# Patient Record
Sex: Female | Born: 1940 | Race: White | Hispanic: Yes | Smoking: Never smoker
Health system: Southern US, Community
[De-identification: ages and names within clinical notes are randomized; demographics above are authoritative.]

## PROBLEM LIST (undated history)

## (undated) DIAGNOSIS — L509 Urticaria, unspecified: Secondary | ICD-10-CM

## (undated) DIAGNOSIS — E079 Disorder of thyroid, unspecified: Secondary | ICD-10-CM

## (undated) DIAGNOSIS — E039 Hypothyroidism, unspecified: Secondary | ICD-10-CM

## (undated) DIAGNOSIS — K219 Gastro-esophageal reflux disease without esophagitis: Secondary | ICD-10-CM

## (undated) HISTORY — DX: Urticaria, unspecified: L50.9

## (undated) HISTORY — DX: Gastro-esophageal reflux disease without esophagitis: K21.9

## (undated) HISTORY — PX: KNEE ARTHROSCOPY: SHX127

## (undated) HISTORY — DX: Hypothyroidism, unspecified: E03.9

---
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
## 1978-09-11 HISTORY — PX: TUBAL LIGATION: SHX77

## 1986-09-11 HISTORY — PX: OTHER SURGICAL HISTORY: SHX169

## 2001-09-11 HISTORY — PX: SURGERY OF LIP: SUR1315

## 2004-02-10 HISTORY — PX: LAPAROSCOPIC CHOLECYSTECTOMY: SUR755

## 2009-11-16 HISTORY — PX: PATELLECTOMY: SHX1022

## 2009-11-16 HISTORY — PX: KNEE SURGERY: SHX244

## 2014-12-14 HISTORY — PX: HAND SURGERY: SHX662

## 2015-11-02 DIAGNOSIS — E7800 Pure hypercholesterolemia, unspecified: Secondary | ICD-10-CM | POA: Diagnosis not present

## 2015-11-02 DIAGNOSIS — Z7189 Other specified counseling: Secondary | ICD-10-CM | POA: Diagnosis not present

## 2015-11-02 DIAGNOSIS — H6592 Unspecified nonsuppurative otitis media, left ear: Secondary | ICD-10-CM | POA: Diagnosis not present

## 2015-11-02 DIAGNOSIS — E039 Hypothyroidism, unspecified: Secondary | ICD-10-CM | POA: Diagnosis not present

## 2015-11-02 DIAGNOSIS — K649 Unspecified hemorrhoids: Secondary | ICD-10-CM | POA: Diagnosis not present

## 2015-11-02 DIAGNOSIS — R7303 Prediabetes: Secondary | ICD-10-CM | POA: Diagnosis not present

## 2015-11-02 DIAGNOSIS — K279 Peptic ulcer, site unspecified, unspecified as acute or chronic, without hemorrhage or perforation: Secondary | ICD-10-CM | POA: Diagnosis not present

## 2015-11-02 DIAGNOSIS — A048 Other specified bacterial intestinal infections: Secondary | ICD-10-CM | POA: Diagnosis not present

## 2015-11-26 DIAGNOSIS — J209 Acute bronchitis, unspecified: Secondary | ICD-10-CM | POA: Diagnosis not present

## 2015-11-26 DIAGNOSIS — J019 Acute sinusitis, unspecified: Secondary | ICD-10-CM | POA: Diagnosis not present

## 2015-11-29 DIAGNOSIS — J329 Chronic sinusitis, unspecified: Secondary | ICD-10-CM | POA: Diagnosis not present

## 2015-11-29 DIAGNOSIS — J309 Allergic rhinitis, unspecified: Secondary | ICD-10-CM | POA: Diagnosis not present

## 2015-11-29 DIAGNOSIS — E039 Hypothyroidism, unspecified: Secondary | ICD-10-CM | POA: Diagnosis not present

## 2015-11-29 DIAGNOSIS — J209 Acute bronchitis, unspecified: Secondary | ICD-10-CM | POA: Diagnosis not present

## 2015-12-03 DIAGNOSIS — J019 Acute sinusitis, unspecified: Secondary | ICD-10-CM | POA: Diagnosis not present

## 2015-12-03 DIAGNOSIS — Z6831 Body mass index (BMI) 31.0-31.9, adult: Secondary | ICD-10-CM | POA: Diagnosis not present

## 2015-12-03 DIAGNOSIS — Z0000 Encounter for general adult medical examination without abnormal findings: Secondary | ICD-10-CM | POA: Diagnosis not present

## 2015-12-03 DIAGNOSIS — J209 Acute bronchitis, unspecified: Secondary | ICD-10-CM | POA: Diagnosis not present

## 2015-12-03 DIAGNOSIS — H6503 Acute serous otitis media, bilateral: Secondary | ICD-10-CM | POA: Diagnosis not present

## 2015-12-05 ENCOUNTER — Emergency Department (HOSPITAL_COMMUNITY)
Admission: EM | Admit: 2015-12-05 | Discharge: 2015-12-05 | Disposition: A | Discharge status: Home / Self Care | Payer: PPO | Attending: Emergency Medicine | Admitting: Emergency Medicine

## 2015-12-05 ENCOUNTER — Encounter (HOSPITAL_COMMUNITY): Payer: Self-pay | Admitting: Emergency Medicine

## 2015-12-05 DIAGNOSIS — M62830 Muscle spasm of back: Secondary | ICD-10-CM | POA: Diagnosis not present

## 2015-12-05 DIAGNOSIS — Z79899 Other long term (current) drug therapy: Secondary | ICD-10-CM | POA: Diagnosis not present

## 2015-12-05 DIAGNOSIS — E079 Disorder of thyroid, unspecified: Secondary | ICD-10-CM | POA: Insufficient documentation

## 2015-12-05 DIAGNOSIS — Z880 Allergy status to penicillin: Secondary | ICD-10-CM | POA: Insufficient documentation

## 2015-12-05 DIAGNOSIS — M545 Low back pain: Secondary | ICD-10-CM | POA: Diagnosis not present

## 2015-12-05 HISTORY — DX: Disorder of thyroid, unspecified: E07.9

## 2015-12-05 LAB — URINE MICROSCOPIC-ADD ON

## 2015-12-05 LAB — I-STAT CHEM 8, ED
BUN: 15 mg/dL (ref 6–20)
CALCIUM ION: 1.25 mmol/L (ref 1.13–1.30)
Chloride: 100 mmol/L — ABNORMAL LOW (ref 101–111)
Creatinine, Ser: 0.9 mg/dL (ref 0.44–1.00)
GLUCOSE: 120 mg/dL — AB (ref 65–99)
HEMATOCRIT: 42 % (ref 36.0–46.0)
Hemoglobin: 14.3 g/dL (ref 12.0–15.0)
Potassium: 4.1 mmol/L (ref 3.5–5.1)
SODIUM: 137 mmol/L (ref 135–145)
TCO2: 25 mmol/L (ref 0–100)

## 2015-12-05 LAB — URINALYSIS, ROUTINE W REFLEX MICROSCOPIC
Bilirubin Urine: NEGATIVE
GLUCOSE, UA: NEGATIVE mg/dL
KETONES UR: NEGATIVE mg/dL
Nitrite: NEGATIVE
PROTEIN: NEGATIVE mg/dL
Specific Gravity, Urine: 1.016 (ref 1.005–1.030)
pH: 6.5 (ref 5.0–8.0)

## 2015-12-05 MED ORDER — CYCLOBENZAPRINE HCL 10 MG PO TABS
5.0000 mg | ORAL_TABLET | Freq: Once | ORAL | Status: AC
  Administered 2015-12-05: 5 mg via ORAL
  Filled 2015-12-05: qty 1

## 2015-12-05 MED ORDER — IBUPROFEN 600 MG PO TABS: 600.0000 mg | ORAL_TABLET | Freq: Four times a day (QID) | ORAL | Status: DC | PRN

## 2015-12-05 MED ORDER — DEXAMETHASONE SODIUM PHOSPHATE 10 MG/ML IJ SOLN
10.0000 mg | Freq: Once | INTRAMUSCULAR | Status: AC
  Administered 2015-12-05: 10 mg via INTRAMUSCULAR
  Filled 2015-12-05: qty 1

## 2015-12-05 MED ORDER — CYCLOBENZAPRINE HCL 5 MG PO TABS: 5.0000 mg | ORAL_TABLET | Freq: Three times a day (TID) | ORAL | Status: DC | PRN

## 2015-12-05 NOTE — ED Notes (Signed)
Patient with back spasms that started yesterday pm around 1830.  Patient states that she has had a hard time getting in and out of bed.  Patient has been trying heat, ice and capcasin pouches, with no relief.

## 2015-12-05 NOTE — Discharge Instructions (Signed)
You were seen today for back spasming. We giving a muscle relaxer and anti-inflammatory medications. Limits ibuprofen use to 3-5 days. See return precautions below. If you develop difficulty ambulating, difficulty with her bowel or bladder, weakness, numbness, or tingling of the lower extremities you should be reevaluated.  Back Exercises The following exercises strengthen the muscles that help to support the back. They also help to keep the lower back flexible. Doing these exercises can help to prevent back pain or lessen existing pain. If you have back pain or discomfort, try doing these exercises 2-3 times each day or as told by your health care provider. When the pain goes away, do them once each day, but increase the number of times that you repeat the steps for each exercise (do more repetitions). If you do not have back pain or discomfort, do these exercises once each day or as told by your health care provider. EXERCISES Single Knee to Chest Repeat these steps 3-5 times for each leg:  Lie on your back on a firm bed or the floor with your legs extended.  Bring one knee to your chest. Your other leg should stay extended and in contact with the floor.  Hold your knee in place by grabbing your knee or thigh.  Pull on your knee until you feel a gentle stretch in your lower back.  Hold the stretch for 10-30 seconds.  Slowly release and straighten your leg. Pelvic Tilt Repeat these steps 5-10 times:  Lie on your back on a firm bed or the floor with your legs extended.  Bend your knees so they are pointing toward the ceiling and your feet are flat on the floor.  Tighten your lower abdominal muscles to press your lower back against the floor. This motion will tilt your pelvis so your tailbone points up toward the ceiling instead of pointing to your feet or the floor.  With gentle tension and even breathing, hold this position for 5-10 seconds. Cat-Cow Repeat these steps until your lower  back becomes more flexible:  Get into a hands-and-knees position on a firm surface. Keep your hands under your shoulders, and keep your knees under your hips. You may place padding under your knees for comfort.  Let your head hang down, and point your tailbone toward the floor so your lower back becomes rounded like the back of a cat.  Hold this position for 5 seconds.  Slowly lift your head and point your tailbone up toward the ceiling so your back forms a sagging arch like the back of a cow.  Hold this position for 5 seconds. Press-Ups Repeat these steps 5-10 times:  Lie on your abdomen (face-down) on the floor.  Place your palms near your head, about shoulder-width apart.  While you keep your back as relaxed as possible and keep your hips on the floor, slowly straighten your arms to raise the top half of your body and lift your shoulders. Do not use your back muscles to raise your upper torso. You may adjust the placement of your hands to make yourself more comfortable.  Hold this position for 5 seconds while you keep your back relaxed.  Slowly return to lying flat on the floor. Bridges Repeat these steps 10 times:  Lie on your back on a firm surface.  Bend your knees so they are pointing toward the ceiling and your feet are flat on the floor.  Tighten your buttocks muscles and lift your buttocks off of the floor until  your waist is at almost the same height as your knees. You should feel the muscles working in your buttocks and the back of your thighs. If you do not feel these muscles, slide your feet 1-2 inches farther away from your buttocks.  Hold this position for 3-5 seconds.  Slowly lower your hips to the starting position, and allow your buttocks muscles to relax completely. If this exercise is too easy, try doing it with your arms crossed over your chest. Abdominal Crunches Repeat these steps 5-10 times: 1. Lie on your back on a firm bed or the floor with your legs  extended. 2. Bend your knees so they are pointing toward the ceiling and your feet are flat on the floor. 3. Cross your arms over your chest. 4. Tip your chin slightly toward your chest without bending your neck. 5. Tighten your abdominal muscles and slowly raise your trunk (torso) high enough to lift your shoulder blades a tiny bit off of the floor. Avoid raising your torso higher than that, because it can put too much stress on your low back and it does not help to strengthen your abdominal muscles. 6. Slowly return to your starting position. Back Lifts Repeat these steps 5-10 times: 1. Lie on your abdomen (face-down) with your arms at your sides, and rest your forehead on the floor. 2. Tighten the muscles in your legs and your buttocks. 3. Slowly lift your chest off of the floor while you keep your hips pressed to the floor. Keep the back of your head in line with the curve in your back. Your eyes should be looking at the floor. 4. Hold this position for 3-5 seconds. 5. Slowly return to your starting position. SEEK MEDICAL CARE IF:  Your back pain or discomfort gets much worse when you do an exercise.  Your back pain or discomfort does not lessen within 2 hours after you exercise. If you have any of these problems, stop doing these exercises right away. Do not do them again unless your health care provider says that you can. SEEK IMMEDIATE MEDICAL CARE IF:  You develop sudden, severe back pain. If this happens, stop doing the exercises right away. Do not do them again unless your health care provider says that you can.   This information is not intended to replace advice given to you by your health care provider. Make sure you discuss any questions you have with your health care provider.   Document Released: 10/05/2004 Document Revised: 05/19/2015 Document Reviewed: 10/22/2014 Elsevier Interactive Patient Education 2016 Else[employer] Muscle Cramps and Spasms Muscle cramps and spasms  occur when a muscle or muscles tighten and you have no control over this tightening (involuntary muscle contraction). They are a common problem and can develop in any muscle. The most common place is in the calf muscles of the leg. Both muscle cramps and muscle spasms are involuntary muscle contractions, but they also have differences:   Muscle cramps are sporadic and painful. They may last a few seconds to a quarter of an hour. Muscle cramps are often more forceful and last longer than muscle spasms.  Muscle spasms may or may not be painful. They may also last just a few seconds or much longer. CAUSES  It is uncommon for cramps or spasms to be due to a serious underlying problem. In many cases, the cause of cramps or spasms is unknown. Some common causes are:   Overexertion.   Overuse from repetitive motions (doing the same  thing over and over).   Remaining in a certain position for a long period of time.   Improper preparation, form, or technique while performing a sport or activity.   Dehydration.   Injury.   Side effects of some medicines.   Abnormally low levels of the salts and ions in your blood (electrolytes), especially potassium and calcium. This could happen if you are taking water pills (diuretics) or you are pregnant.  Some underlying medical problems can make it more likely to develop cramps or spasms. These include, but are not limited to:   Diabetes.   Parkinson disease.   Hormone disorders, such as thyroid problems.   Alcohol abuse.   Diseases specific to muscles, joints, and bones.   Blood vessel disease where not enough blood is getting to the muscles.  HOME CARE INSTRUCTIONS   Stay well hydrated. Drink enough water and fluids to keep your urine clear or pale yellow.  It may be helpful to massage, stretch, and relax the affected muscle.  For tight or tense muscles, use a warm towel, heating pad, or hot shower water directed to the affected  area.  If you are sore or have pain after a cramp or spasm, applying ice to the affected area may relieve discomfort.  Put ice in a plastic bag.  Place a towel between your skin and the bag.  Leave the ice on for 15-20 minutes, 03-04 times a day.  Medicines used to treat a known cause of cramps or spasms may help reduce their frequency or severity. Only take over-the-counter or prescription medicines as directed by your caregiver. SEEK MEDICAL CARE IF:  Your cramps or spasms get more severe, more frequent, or do not improve over time.  MAKE SURE YOU:   Understand these instructions.  Will watch your condition.  Will get help right away if you are not doing well or get worse.   This information is not intended to replace advice given to you by your health care provider. Make sure you discuss any questions you have with your health care provider.   Document Released: 02/17/2002 Document Revised: 12/23/2012 Document Reviewed: 08/14/2012 Elsevier Interactive Patient Education [employer].

## 2015-12-05 NOTE — ED Provider Notes (Signed)
CSN: [id number]     Arrival date & time 12/05/15  0140 History   First MD Initiated Contact with Patient 12/05/15 [phone]     Chief Complaint  Patient presents with  . Spasms     (Consider location/radiation/quality/duration/timing/severity/associated sxs/prior Treatment) HPI  This is a 75 year old female who presents with back spasming. Patient states that she had onset of symptoms yesterday. She states that her spasms are worse with movement and located over her right flank and right lower back. Denies any radiation of the pain. Denies any difficulty with her bowel or bladder. Denies any weakness, numbness, tingling of the lower extremities. Denies any hematuria or dysuria. Denies chest pain, shortness of breath, fevers. She states that she has had difficulty "getting around." She is tried heat, ice, capsaicin without relief. She denies abdominal pain, nausea, vomiting.  Past Medical History  Diagnosis Date  . Thyroid disease    History reviewed. No pertinent past surgical history. No family history on file. Social History  Substance Use Topics  . Smoking status: Never Smoker   . Smokeless tobacco: None  . Alcohol Use: None   OB History    No data available     Review of Systems  Constitutional: Negative for fever.  Cardiovascular: Negative for chest pain.  Gastrointestinal: Negative for nausea, vomiting and abdominal pain.  Genitourinary: Negative for dysuria, hematuria and difficulty urinating.  Musculoskeletal: Positive for back pain.  Neurological: Negative for weakness and numbness.  All other systems reviewed and are negative.     Allergies  Ampicillin  Home Medications   Prior to Admission medications   Medication Sig Start Date End Date Taking? Authorizing Provider  thyroid (ARMOUR) 30 MG tablet Take 30 mg by mouth daily before breakfast.   Yes Historical Provider, MD  cyclobenzaprine (FLEXERIL) 5 MG tablet Take 1 tablet (5 mg total) by mouth 3 (three) times  daily as needed for muscle spasms. 12/05/15   Merryl Hacker, MD  ibuprofen (ADVIL,MOTRIN) 600 MG tablet Take 1 tablet (600 mg total) by mouth every 6 (six) hours as needed. 12/05/15   Merryl Hacker, MD   BP 142/58 mmHg  Pulse 83  Temp(Src) 99 F (37.2 C) (Oral)  Resp 16  SpO2 99% Physical Exam  Constitutional: She is oriented to person, place, and time. She appears well-developed and well-nourished. No distress.  HENT:  Head: Normocephalic and atraumatic.  Cardiovascular: Normal rate, regular rhythm and normal heart sounds.   Pulmonary/Chest: Effort normal. No respiratory distress.  Abdominal: Soft. There is no tenderness.  Musculoskeletal: She exhibits no edema.  Tenderness palpation right lower back and right SI joint, negative straight leg raises  Neurological: She is alert and oriented to person, place, and time.  5 out of 5 strength with hip flexion, knee flexion and extension bilaterally, no clonus noted bilaterally  Skin: Skin is warm and dry.  Psychiatric: She has a normal mood and affect.  Nursing note and vitals reviewed.   ED Course  Procedures (including critical care time) Labs Review Labs Reviewed  URINALYSIS, ROUTINE W REFLEX MICROSCOPIC (NOT AT [facility]) - Abnormal; Notable for the following:    Hgb urine dipstick TRACE (*)    Leukocytes, UA SMALL (*)    All other components within normal limits  URINE MICROSCOPIC-ADD ON - Abnormal; Notable for the following:    Squamous Epithelial / LPF 0-5 (*)    Bacteria, UA FEW (*)    All other components within normal limits  I-STAT CHEM 8,  ED - Abnormal; Notable for the following:    Chloride 100 (*)    Glucose, Bld 120 (*)    All other components within normal limits  URINALYSIS, ROUTINE W REFLEX MICROSCOPIC (NOT AT [facility])    Imaging Review No results found. I have personally reviewed and evaluated these images and lab results as part of my medical decision-making.   EKG Interpretation None      MDM    Final diagnoses:  Back spasm    Patient presents with reported back spasms. Reproducible on exam. Otherwise nontoxic. She denies any other symptoms. Pain does appear musculoskeletal in nature. Screening urinalysis reassuring. Chem-8 to evaluate for kidney function was reassuring. Patient was given IM Decadron and Flexeril. On recheck, she reports significant improvement of pain. She is ambulatory independently and at her baseline. Discussed with patient that she can use NSAIDs for a short duration of 3-5 days. We'll also discharge with a short course of Flexeril. Patient stated understanding.  After history, exam, and medical workup I feel the patient has been appropriately medically screened and is safe for discharge home. Pertinent diagnoses were discussed with the patient. Patient was given return precautions.     Merryl Hacker, MD 12/05/15 [phone]

## 2015-12-15 DIAGNOSIS — H6522 Chronic serous otitis media, left ear: Secondary | ICD-10-CM | POA: Diagnosis not present

## 2015-12-15 DIAGNOSIS — H9042 Sensorineural hearing loss, unilateral, left ear, with unrestricted hearing on the contralateral side: Secondary | ICD-10-CM | POA: Diagnosis not present

## 2015-12-15 DIAGNOSIS — J301 Allergic rhinitis due to pollen: Secondary | ICD-10-CM | POA: Diagnosis not present

## 2016-01-12 DIAGNOSIS — H2513 Age-related nuclear cataract, bilateral: Secondary | ICD-10-CM | POA: Diagnosis not present

## 2016-02-16 DIAGNOSIS — E039 Hypothyroidism, unspecified: Secondary | ICD-10-CM | POA: Diagnosis not present

## 2016-02-16 DIAGNOSIS — E7800 Pure hypercholesterolemia, unspecified: Secondary | ICD-10-CM | POA: Diagnosis not present

## 2016-02-16 DIAGNOSIS — K279 Peptic ulcer, site unspecified, unspecified as acute or chronic, without hemorrhage or perforation: Secondary | ICD-10-CM | POA: Diagnosis not present

## 2016-02-16 DIAGNOSIS — R7303 Prediabetes: Secondary | ICD-10-CM | POA: Diagnosis not present

## 2016-02-16 DIAGNOSIS — R7309 Other abnormal glucose: Secondary | ICD-10-CM | POA: Diagnosis not present

## 2016-02-24 DIAGNOSIS — E7800 Pure hypercholesterolemia, unspecified: Secondary | ICD-10-CM | POA: Diagnosis not present

## 2016-02-24 DIAGNOSIS — J302 Other seasonal allergic rhinitis: Secondary | ICD-10-CM | POA: Diagnosis not present

## 2016-02-24 DIAGNOSIS — L509 Urticaria, unspecified: Secondary | ICD-10-CM | POA: Diagnosis not present

## 2016-02-24 DIAGNOSIS — B009 Herpesviral infection, unspecified: Secondary | ICD-10-CM | POA: Diagnosis not present

## 2016-02-24 DIAGNOSIS — K279 Peptic ulcer, site unspecified, unspecified as acute or chronic, without hemorrhage or perforation: Secondary | ICD-10-CM | POA: Diagnosis not present

## 2016-02-24 DIAGNOSIS — J40 Bronchitis, not specified as acute or chronic: Secondary | ICD-10-CM | POA: Diagnosis not present

## 2016-02-24 DIAGNOSIS — E039 Hypothyroidism, unspecified: Secondary | ICD-10-CM | POA: Diagnosis not present

## 2016-02-24 DIAGNOSIS — R7303 Prediabetes: Secondary | ICD-10-CM | POA: Diagnosis not present

## 2016-04-10 DIAGNOSIS — J309 Allergic rhinitis, unspecified: Secondary | ICD-10-CM | POA: Diagnosis not present

## 2016-04-10 DIAGNOSIS — J069 Acute upper respiratory infection, unspecified: Secondary | ICD-10-CM | POA: Diagnosis not present

## 2016-04-24 DIAGNOSIS — J309 Allergic rhinitis, unspecified: Secondary | ICD-10-CM | POA: Diagnosis not present

## 2016-05-01 DIAGNOSIS — E039 Hypothyroidism, unspecified: Secondary | ICD-10-CM | POA: Diagnosis not present

## 2016-05-01 DIAGNOSIS — R7303 Prediabetes: Secondary | ICD-10-CM | POA: Diagnosis not present

## 2016-05-01 DIAGNOSIS — M7532 Calcific tendinitis of left shoulder: Secondary | ICD-10-CM | POA: Diagnosis not present

## 2016-05-01 DIAGNOSIS — K649 Unspecified hemorrhoids: Secondary | ICD-10-CM | POA: Diagnosis not present

## 2016-05-01 DIAGNOSIS — M65341 Trigger finger, right ring finger: Secondary | ICD-10-CM | POA: Diagnosis not present

## 2016-05-02 DIAGNOSIS — M7582 Other shoulder lesions, left shoulder: Secondary | ICD-10-CM | POA: Diagnosis not present

## 2016-05-26 DIAGNOSIS — R7303 Prediabetes: Secondary | ICD-10-CM | POA: Diagnosis not present

## 2016-05-26 DIAGNOSIS — B009 Herpesviral infection, unspecified: Secondary | ICD-10-CM | POA: Diagnosis not present

## 2016-05-26 DIAGNOSIS — J40 Bronchitis, not specified as acute or chronic: Secondary | ICD-10-CM | POA: Diagnosis not present

## 2016-05-26 DIAGNOSIS — E039 Hypothyroidism, unspecified: Secondary | ICD-10-CM | POA: Diagnosis not present

## 2016-05-26 DIAGNOSIS — K279 Peptic ulcer, site unspecified, unspecified as acute or chronic, without hemorrhage or perforation: Secondary | ICD-10-CM | POA: Diagnosis not present

## 2016-05-26 DIAGNOSIS — J302 Other seasonal allergic rhinitis: Secondary | ICD-10-CM | POA: Diagnosis not present

## 2016-05-26 DIAGNOSIS — L509 Urticaria, unspecified: Secondary | ICD-10-CM | POA: Diagnosis not present

## 2016-05-26 DIAGNOSIS — E7800 Pure hypercholesterolemia, unspecified: Secondary | ICD-10-CM | POA: Diagnosis not present

## 2016-05-30 DIAGNOSIS — M5412 Radiculopathy, cervical region: Secondary | ICD-10-CM | POA: Diagnosis not present

## 2016-05-30 DIAGNOSIS — M65341 Trigger finger, right ring finger: Secondary | ICD-10-CM | POA: Diagnosis not present

## 2016-06-05 DIAGNOSIS — Z124 Encounter for screening for malignant neoplasm of cervix: Secondary | ICD-10-CM | POA: Diagnosis not present

## 2016-06-05 DIAGNOSIS — Z01419 Encounter for gynecological examination (general) (routine) without abnormal findings: Secondary | ICD-10-CM | POA: Diagnosis not present

## 2016-06-05 DIAGNOSIS — Z1231 Encounter for screening mammogram for malignant neoplasm of breast: Secondary | ICD-10-CM | POA: Diagnosis not present

## 2016-06-05 DIAGNOSIS — R87611 Atypical squamous cells cannot exclude high grade squamous intraepithelial lesion on cytologic smear of cervix (ASC-H): Secondary | ICD-10-CM | POA: Diagnosis not present

## 2016-06-05 DIAGNOSIS — R87610 Atypical squamous cells of undetermined significance on cytologic smear of cervix (ASC-US): Secondary | ICD-10-CM | POA: Diagnosis not present

## 2016-06-09 DIAGNOSIS — M542 Cervicalgia: Secondary | ICD-10-CM | POA: Diagnosis not present

## 2016-06-19 DIAGNOSIS — B009 Herpesviral infection, unspecified: Secondary | ICD-10-CM | POA: Diagnosis not present

## 2016-06-19 DIAGNOSIS — H6982 Other specified disorders of Eustachian tube, left ear: Secondary | ICD-10-CM | POA: Diagnosis not present

## 2016-06-19 DIAGNOSIS — M5412 Radiculopathy, cervical region: Secondary | ICD-10-CM | POA: Diagnosis not present

## 2016-06-19 DIAGNOSIS — R7303 Prediabetes: Secondary | ICD-10-CM | POA: Diagnosis not present

## 2016-06-19 DIAGNOSIS — E7800 Pure hypercholesterolemia, unspecified: Secondary | ICD-10-CM | POA: Diagnosis not present

## 2016-06-19 DIAGNOSIS — E039 Hypothyroidism, unspecified: Secondary | ICD-10-CM | POA: Diagnosis not present

## 2016-06-19 DIAGNOSIS — M62838 Other muscle spasm: Secondary | ICD-10-CM | POA: Diagnosis not present

## 2016-06-27 DIAGNOSIS — M542 Cervicalgia: Secondary | ICD-10-CM | POA: Diagnosis not present

## 2016-06-27 DIAGNOSIS — M5412 Radiculopathy, cervical region: Secondary | ICD-10-CM | POA: Diagnosis not present

## 2016-06-28 DIAGNOSIS — M542 Cervicalgia: Secondary | ICD-10-CM | POA: Diagnosis not present

## 2016-07-04 DIAGNOSIS — M542 Cervicalgia: Secondary | ICD-10-CM | POA: Diagnosis not present

## 2016-07-06 DIAGNOSIS — M542 Cervicalgia: Secondary | ICD-10-CM | POA: Diagnosis not present

## 2016-07-07 DIAGNOSIS — J302 Other seasonal allergic rhinitis: Secondary | ICD-10-CM | POA: Insufficient documentation

## 2016-07-07 DIAGNOSIS — H6123 Impacted cerumen, bilateral: Secondary | ICD-10-CM | POA: Insufficient documentation

## 2016-07-07 DIAGNOSIS — H6983 Other specified disorders of Eustachian tube, bilateral: Secondary | ICD-10-CM | POA: Diagnosis not present

## 2016-07-07 DIAGNOSIS — H6993 Unspecified Eustachian tube disorder, bilateral: Secondary | ICD-10-CM | POA: Insufficient documentation

## 2016-07-07 DIAGNOSIS — H9192 Unspecified hearing loss, left ear: Secondary | ICD-10-CM | POA: Diagnosis not present

## 2016-07-07 DIAGNOSIS — H6122 Impacted cerumen, left ear: Secondary | ICD-10-CM | POA: Diagnosis not present

## 2016-07-11 DIAGNOSIS — M542 Cervicalgia: Secondary | ICD-10-CM | POA: Diagnosis not present

## 2016-07-13 DIAGNOSIS — M542 Cervicalgia: Secondary | ICD-10-CM | POA: Diagnosis not present

## 2016-07-18 DIAGNOSIS — M542 Cervicalgia: Secondary | ICD-10-CM | POA: Diagnosis not present

## 2016-07-25 DIAGNOSIS — M542 Cervicalgia: Secondary | ICD-10-CM | POA: Diagnosis not present

## 2016-07-27 DIAGNOSIS — M542 Cervicalgia: Secondary | ICD-10-CM | POA: Diagnosis not present

## 2016-08-08 DIAGNOSIS — M542 Cervicalgia: Secondary | ICD-10-CM | POA: Diagnosis not present

## 2016-08-08 DIAGNOSIS — M7062 Trochanteric bursitis, left hip: Secondary | ICD-10-CM | POA: Diagnosis not present

## 2016-08-16 DIAGNOSIS — R1011 Right upper quadrant pain: Secondary | ICD-10-CM | POA: Diagnosis not present

## 2016-08-25 DIAGNOSIS — R739 Hyperglycemia, unspecified: Secondary | ICD-10-CM | POA: Diagnosis not present

## 2016-08-25 DIAGNOSIS — E039 Hypothyroidism, unspecified: Secondary | ICD-10-CM | POA: Diagnosis not present

## 2016-08-25 DIAGNOSIS — R7303 Prediabetes: Secondary | ICD-10-CM | POA: Diagnosis not present

## 2016-08-25 DIAGNOSIS — E7800 Pure hypercholesterolemia, unspecified: Secondary | ICD-10-CM | POA: Diagnosis not present

## 2016-08-25 DIAGNOSIS — L509 Urticaria, unspecified: Secondary | ICD-10-CM | POA: Diagnosis not present

## 2016-08-25 DIAGNOSIS — R61 Generalized hyperhidrosis: Secondary | ICD-10-CM | POA: Diagnosis not present

## 2016-09-21 DIAGNOSIS — J309 Allergic rhinitis, unspecified: Secondary | ICD-10-CM | POA: Diagnosis not present

## 2016-09-21 DIAGNOSIS — J208 Acute bronchitis due to other specified organisms: Secondary | ICD-10-CM | POA: Diagnosis not present

## 2016-09-21 DIAGNOSIS — H6062 Unspecified chronic otitis externa, left ear: Secondary | ICD-10-CM | POA: Diagnosis not present

## 2016-11-16 DIAGNOSIS — E7800 Pure hypercholesterolemia, unspecified: Secondary | ICD-10-CM | POA: Diagnosis not present

## 2016-11-16 DIAGNOSIS — R7303 Prediabetes: Secondary | ICD-10-CM | POA: Diagnosis not present

## 2016-11-16 DIAGNOSIS — M25642 Stiffness of left hand, not elsewhere classified: Secondary | ICD-10-CM | POA: Diagnosis not present

## 2016-11-16 DIAGNOSIS — M25641 Stiffness of right hand, not elsewhere classified: Secondary | ICD-10-CM | POA: Diagnosis not present

## 2016-11-16 DIAGNOSIS — R739 Hyperglycemia, unspecified: Secondary | ICD-10-CM | POA: Diagnosis not present

## 2016-11-16 DIAGNOSIS — E039 Hypothyroidism, unspecified: Secondary | ICD-10-CM | POA: Diagnosis not present

## 2016-11-16 DIAGNOSIS — R1011 Right upper quadrant pain: Secondary | ICD-10-CM | POA: Diagnosis not present

## 2016-11-16 DIAGNOSIS — R61 Generalized hyperhidrosis: Secondary | ICD-10-CM | POA: Diagnosis not present

## 2016-11-16 DIAGNOSIS — L509 Urticaria, unspecified: Secondary | ICD-10-CM | POA: Diagnosis not present

## 2016-12-07 DIAGNOSIS — R61 Generalized hyperhidrosis: Secondary | ICD-10-CM | POA: Diagnosis not present

## 2016-12-07 DIAGNOSIS — L509 Urticaria, unspecified: Secondary | ICD-10-CM | POA: Diagnosis not present

## 2016-12-07 DIAGNOSIS — E7800 Pure hypercholesterolemia, unspecified: Secondary | ICD-10-CM | POA: Diagnosis not present

## 2016-12-07 DIAGNOSIS — M25642 Stiffness of left hand, not elsewhere classified: Secondary | ICD-10-CM | POA: Diagnosis not present

## 2016-12-07 DIAGNOSIS — R030 Elevated blood-pressure reading, without diagnosis of hypertension: Secondary | ICD-10-CM | POA: Diagnosis not present

## 2016-12-07 DIAGNOSIS — R739 Hyperglycemia, unspecified: Secondary | ICD-10-CM | POA: Diagnosis not present

## 2016-12-07 DIAGNOSIS — E039 Hypothyroidism, unspecified: Secondary | ICD-10-CM | POA: Diagnosis not present

## 2016-12-07 DIAGNOSIS — R7303 Prediabetes: Secondary | ICD-10-CM | POA: Diagnosis not present

## 2016-12-07 DIAGNOSIS — M25641 Stiffness of right hand, not elsewhere classified: Secondary | ICD-10-CM | POA: Diagnosis not present

## 2017-01-12 DIAGNOSIS — H2513 Age-related nuclear cataract, bilateral: Secondary | ICD-10-CM | POA: Diagnosis not present

## 2017-01-16 ENCOUNTER — Encounter: Payer: Self-pay | Admitting: Allergy and Immunology

## 2017-01-16 ENCOUNTER — Ambulatory Visit (INDEPENDENT_AMBULATORY_CARE_PROVIDER_SITE_OTHER): Payer: PPO | Admitting: Allergy and Immunology

## 2017-01-16 VITALS — BP 168/74 | HR 64 | Temp 98.0°F | Resp 18 | Ht 62.4 in | Wt 174.2 lb

## 2017-01-16 DIAGNOSIS — M25642 Stiffness of left hand, not elsewhere classified: Secondary | ICD-10-CM | POA: Diagnosis not present

## 2017-01-16 DIAGNOSIS — M25641 Stiffness of right hand, not elsewhere classified: Secondary | ICD-10-CM

## 2017-01-16 DIAGNOSIS — J3089 Other allergic rhinitis: Secondary | ICD-10-CM | POA: Diagnosis not present

## 2017-01-16 DIAGNOSIS — L500 Allergic urticaria: Secondary | ICD-10-CM

## 2017-01-16 NOTE — Progress Notes (Signed)
Dear Dr. Jacelyn Grip,  Thank you for referring Jaklyn Alen to the [facility] on 01/16/2017.   Below is a summation of this patient's evaluation and recommendations.  Thank you for your referral. I will keep you informed about this patient's response to treatment.   If you have any questions please do not hesitate to contact me.   Sincerely,  Jiles Prows, MD Allergy / Immunology [place]   ______________________________________________________________________    NEW PATIENT NOTE  Referring Provider: Vernie Shanks, MD Primary Provider: Vernie Shanks, MD Date of office visit: 01/16/2017    Subjective:   Chief Complaint:  Meredith Escobar (DOB: July 06, 1941) is a 76 y.o. female who presents to the clinic on 01/16/2017 with a chief complaint of Urticaria .     HPI: Vicente Males presents to this clinic in evaluation of urticaria. Apparently in March 2016 she had an infection of her hand requiring surgery. This may have been MRSA infection. Ever since that infection she has been having recurrent red raised itchy lesions developing across her body on a daily basis that never heal with scar or hyperpigmentation and are not associated with any systemic or constitutional symptoms. She saw a dermatologist while living in [place] who performed a biopsy in July 2017 which identified "urticaria". She uses levocetirizine on a daily basis mostly at nighttime because of the sedation associated with this agent and this does help her hives in regard to the itchiness threshold.  There is no obvious provoking factor giving rise to this issue. She really feels quite good and does not really note a trigger that is resulting in her hives. She has noticed that she has developed morning hand stiffness over the course of the past several months and apparently has had some blood tests in investigation of this issue. Otherwise, she has  no associated joint problems. She does have a history of gastroesophageal reflux disease and apparently had an upper endoscopy performed in December 2015. She now uses Prilosec as needed for burning in her stomach.  She does have a history of allergic rhinitis for which she uses Flonase which takes care of this issue for the most part. She will use her Flonase as needed. She has no other associated atopic disease.  She is using multiple supplements and presents with a list of 15 different supplements. She stopped the supplements over the course of the past few weeks.  Past Medical History:  Diagnosis Date  . GERD (gastroesophageal reflux disease)   . Hypothyroidism   . Thyroid disease   . Urticaria     Past Surgical History:  Procedure Laterality Date  . APPENDECTOMY  1955  . HAND SURGERY  12/14/2014   Hand palmar abscess deep irrigation and drainage  . KNEE ARTHROSCOPY     Right Knee-1992 and 2007; Left Knee- 2001  . KNEE SURGERY Right 11/16/2009  . LAPAROSCOPIC CHOLECYSTECTOMY  02/2004  . OTHER SURGICAL HISTORY  1988   Hysterectomy  . PATELLECTOMY  11/16/2009   Partial Patellectomy  . SURGERY OF LIP  2003  . TUBAL LIGATION  1980    Allergies as of 01/16/2017      Reactions   Ampicillin Rash      Medication List      cyclobenzaprine 5 MG tablet Commonly known as:  FLEXERIL Take 1 tablet (5 mg total) by mouth 3 (three) times daily as needed for muscle spasms.  ibuprofen 600 MG tablet Commonly known as:  ADVIL,MOTRIN Take 1 tablet (600 mg total) by mouth every 6 (six) hours as needed.   thyroid 30 MG tablet Commonly known as:  ARMOUR Take 30 mg by mouth daily before breakfast.       Review of systems negative except as noted in HPI / PMHx or noted below:  Review of Systems  Constitutional: Negative.   HENT: Negative.   Eyes: Negative.   Respiratory: Negative.   Cardiovascular: Negative.   Gastrointestinal: Negative.   Genitourinary: Negative.     Musculoskeletal: Negative.   Skin: Negative.   Neurological: Negative.   Endo/Heme/Allergies: Negative.   Psychiatric/Behavioral: Negative.     Family History  Problem Relation Age of Onset  . High blood pressure Mother   . High Cholesterol Mother   . CVA Mother   . CVA Father   . High Cholesterol Father   . Congestive Heart Failure Father   . COPD Father   . Peripheral vascular disease Father   . Breast cancer Sister   . High Cholesterol Sister   . Alzheimer's disease Brother   . Cancer Sister   . Peripheral vascular disease Sister     Social History   Social History  . Marital status: Divorced    Spouse name: N/A  . Number of children: N/A  . Years of education: N/A   Occupational History  . Not on file.   Social History Main Topics  . Smoking status: Never Smoker  . Smokeless tobacco: Never Used  . Alcohol use No  . Drug use: No  . Sexual activity: Not on file   Other Topics Concern  . Not on file   Social History Narrative  . No narrative on file    Environmental and Social history  Lives in a house with a dry environment, a dog located inside the household, carpeting in the bedroom, no plastic on the bed or pillow, no smokers located inside the household.  Objective:   Vitals:   01/16/17 1001  BP: (!) 168/74  Pulse: 64  Resp: 18  Temp: 98 F (36.7 C)   Height: 5' 2.4" (158.5 cm) Weight: 174 lb 3.2 oz (79 kg)  Physical Exam  Constitutional: She is well-developed, well-nourished, and in no distress.  HENT:  Head: Normocephalic. Head is without right periorbital erythema and without left periorbital erythema.  Right Ear: Tympanic membrane, external ear and ear canal normal.  Left Ear: Tympanic membrane, external ear and ear canal normal.  Nose: Nose normal. No mucosal edema or rhinorrhea.  Mouth/Throat: Oropharynx is clear and moist and mucous membranes are normal. No oropharyngeal exudate.  Eyes: Conjunctivae and lids are normal. Pupils  are equal, round, and reactive to light.  Neck: Trachea normal. No tracheal deviation present. No thyromegaly present.  Cardiovascular: Normal rate, regular rhythm, S1 normal, S2 normal and normal heart sounds.   No murmur heard. Pulmonary/Chest: Effort normal. No stridor. No tachypnea. No respiratory distress. She has no wheezes. She has no rales. She exhibits no tenderness.  Abdominal: Soft. She exhibits no distension and no mass. There is no hepatosplenomegaly. There is no tenderness. There is no rebound and no guarding.  Musculoskeletal: She exhibits no edema or tenderness.  Lymphadenopathy:       Head (right side): No tonsillar adenopathy present.       Head (left side): No tonsillar adenopathy present.    She has no cervical adenopathy.    She has no axillary adenopathy.  Neurological: She is alert. Gait normal.  Skin: Rash (multiple blanching urticarial lesions involving hand and upper extremity.) noted. She is not diaphoretic. No erythema. No pallor. Nails show no clubbing.  Psychiatric: Mood and affect normal.    Diagnostics: Allergy skin tests were performed. She demonstrated hypersensitivity to grasses and weeds. She did not demonstrate any hypersensitivity against a screening panel of foods.  Assessment and Plan:    1. Allergic urticaria   2. Other allergic rhinitis   3. Morning joint stiffness of right hand   4. Morning joint stiffness of hand, left     1. Allergen avoidance measures  2. Eliminate all supplement use  3. Use a combination of the following:   A. levocetirizine 5 mg tablet at nighttime  B. loratadine 10 mg tablet in morning  4. Continue nasal fluticasone one-2 sprays each nostril 3-7 times per week  5. Can add OTC Benadryl if needed  6. Consider omalizumab / Xolair administration  7. Review all blood tests including evaluation for hand stiffness  8. Obtain a H. pylori urease breath test without any Prilosec use.  9. 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 Although she is atopic and certainly this atopic disease appears to be manifested as allergic rhinitis there may be other forms of immunological hyperreactivity giving rise to her urticaria and I would like to review all of her blood tests had been performed in the past and consider further evaluation based upon the studies that have been performed to date. Her hand stiffness all his suggest the possibility of impending rheumatoid arthritis which certainly could give rise to a neurological hyperreactivity. She does have intermittent stomach burning and we'll check a urease breath test to rule out persistent Helicobacter pylori infection. If we cannot get her urticaria under good control with the therapy mentioned above then she would be a candidate for omalizumab injections. I will regroup with her in 4 weeks or earlier if there is a problem.  Jiles Prows, MD [facility]

## 2017-01-16 NOTE — Patient Instructions (Addendum)
  1. Allergen avoidance measures  2. Eliminate all supplement use  3. Use a combination of the following:   A. levocetirizine 5 mg tablet at nighttime  B. loratadine 10 mg tablet in morning  4. Continue nasal fluticasone one-2 sprays each nostril 3-7 times per week  5. Can add OTC Benadryl if needed  6. Consider omalizumab / Xolair administration  7. Review all blood tests including evaluation for hand stiffness  8. Obtain a H. pylori urease breath test without any Prilosec use.  9. Return to clinic in 4 weeks or earlier if problem

## 2017-01-17 ENCOUNTER — Encounter: Payer: Self-pay | Admitting: *Deleted

## 2017-01-17 ENCOUNTER — Telehealth: Payer: Self-pay

## 2017-01-17 ENCOUNTER — Other Ambulatory Visit: Payer: Self-pay | Admitting: Allergy and Immunology

## 2017-01-17 DIAGNOSIS — L509 Urticaria, unspecified: Secondary | ICD-10-CM

## 2017-01-17 NOTE — Telephone Encounter (Signed)
I left message for patient to call.  Please ask her to get H. Pylori Breath test at [facility]. She will need to be off her Omeprazole for 2 weeks prior to getting the test completed.  Also cannot have anything to eat or drink one hour prior to the test.  I will place order form at front desk in [place] for patient to pick up.  Thank you

## 2017-01-18 NOTE — Telephone Encounter (Signed)
Patient informed. She will go by [place] to pick up requisition form. Patient did state that she had H. Pylori twice in her life already.

## 2017-01-23 DIAGNOSIS — H60542 Acute eczematoid otitis externa, left ear: Secondary | ICD-10-CM | POA: Diagnosis not present

## 2017-01-23 DIAGNOSIS — M25642 Stiffness of left hand, not elsewhere classified: Secondary | ICD-10-CM | POA: Diagnosis not present

## 2017-01-23 DIAGNOSIS — R030 Elevated blood-pressure reading, without diagnosis of hypertension: Secondary | ICD-10-CM | POA: Diagnosis not present

## 2017-01-23 DIAGNOSIS — H25091 Other age-related incipient cataract, right eye: Secondary | ICD-10-CM | POA: Diagnosis not present

## 2017-01-23 DIAGNOSIS — R61 Generalized hyperhidrosis: Secondary | ICD-10-CM | POA: Diagnosis not present

## 2017-01-23 DIAGNOSIS — M25641 Stiffness of right hand, not elsewhere classified: Secondary | ICD-10-CM | POA: Diagnosis not present

## 2017-01-23 DIAGNOSIS — R7303 Prediabetes: Secondary | ICD-10-CM | POA: Diagnosis not present

## 2017-01-23 DIAGNOSIS — E039 Hypothyroidism, unspecified: Secondary | ICD-10-CM | POA: Diagnosis not present

## 2017-01-23 DIAGNOSIS — E7800 Pure hypercholesterolemia, unspecified: Secondary | ICD-10-CM | POA: Diagnosis not present

## 2017-01-23 DIAGNOSIS — L509 Urticaria, unspecified: Secondary | ICD-10-CM | POA: Diagnosis not present

## 2017-02-01 DIAGNOSIS — L509 Urticaria, unspecified: Secondary | ICD-10-CM | POA: Diagnosis not present

## 2017-02-03 LAB — H. PYLORI BREATH TEST: H PYLORI BREATH TEST: NEGATIVE

## 2017-02-13 ENCOUNTER — Encounter: Payer: Self-pay | Admitting: Allergy and Immunology

## 2017-02-13 ENCOUNTER — Ambulatory Visit (INDEPENDENT_AMBULATORY_CARE_PROVIDER_SITE_OTHER): Payer: PPO | Admitting: Allergy and Immunology

## 2017-02-13 VITALS — BP 140/70 | HR 68 | Resp 18

## 2017-02-13 DIAGNOSIS — L500 Allergic urticaria: Secondary | ICD-10-CM

## 2017-02-13 DIAGNOSIS — M25642 Stiffness of left hand, not elsewhere classified: Secondary | ICD-10-CM | POA: Diagnosis not present

## 2017-02-13 DIAGNOSIS — H1045 Other chronic allergic conjunctivitis: Secondary | ICD-10-CM | POA: Diagnosis not present

## 2017-02-13 DIAGNOSIS — M25641 Stiffness of right hand, not elsewhere classified: Secondary | ICD-10-CM | POA: Diagnosis not present

## 2017-02-13 DIAGNOSIS — H1010 Acute atopic conjunctivitis, unspecified eye: Secondary | ICD-10-CM

## 2017-02-13 DIAGNOSIS — J3089 Other allergic rhinitis: Secondary | ICD-10-CM | POA: Diagnosis not present

## 2017-02-13 MED ORDER — OLOPATADINE HCL 0.7 % OP SOLN: 1.0000 [drp] | Freq: Every day | OPHTHALMIC | 5 refills | Status: DC | PRN

## 2017-02-13 NOTE — Progress Notes (Signed)
Follow-up Note  Referring Provider: Vernie Shanks, MD Primary Provider: Vernie Shanks, MD Date of Office Visit: 02/13/2017  Subjective:   Meredith Escobar (DOB: 15-Sep-1940) is a 76 y.o. female who returns to [facility] on 02/13/2017 in re-evaluation of the following:  HPI: Meredith Escobar presents to this clinic in evaluation of her chronic urticaria, allergic rhinoconjunctivitis, and hand stiffness evaluated during her initial evaluation of 01/16/2017.  She still continues to have urticaria. She feels as though it is under okay control while using her plan at this point. She is not particularly interested in undergoing omalizumab administration at this point.  Her nose is doing very well with nasal fluticasone. She has been having a lot of watery eyes which has been a long-standing issue even before starting her antihistamines.  She still continues to have hand stiffness. Her previous evaluation by Dr. Jacelyn Escobar for this issue did not identify any evidence of autoimmune or inflammatory disease.  Allergies as of 02/13/2017      Reactions   Ampicillin Rash   Gabapentin Rash      Medication List      Biotin 5000 MCG Caps Take by mouth daily.   fluticasone 50 MCG/ACT nasal spray Commonly known as:  FLONASE   GREEN TEA PO Take by mouth.   levocetirizine 5 MG tablet Commonly known as:  XYZAL Take 1 tablet by mouth at bedtime.   loratadine 10 MG tablet Commonly known as:  CLARITIN Take 10 mg by mouth daily.   METHYL B-12 PO Take by mouth daily.   thyroid 30 MG tablet Commonly known as:  ARMOUR Take 30 mg by mouth daily before breakfast.   Vitamin D3 5000 units Tabs Take by mouth daily.       Past Medical History:  Diagnosis Date  . GERD (gastroesophageal reflux disease)   . Hypothyroidism   . Thyroid disease   . Urticaria     Past Surgical History:  Procedure Laterality Date  . APPENDECTOMY  1955  . HAND SURGERY  12/14/2014   Hand palmar abscess deep  irrigation and drainage  . KNEE ARTHROSCOPY     Right Knee-1992 and 2007; Left Knee- 2001  . KNEE SURGERY Right 11/16/2009  . LAPAROSCOPIC CHOLECYSTECTOMY  02/2004  . OTHER SURGICAL HISTORY  1988   Hysterectomy  . PATELLECTOMY  11/16/2009   Partial Patellectomy  . SURGERY OF LIP  2003  . TUBAL LIGATION  1980    Review of systems negative except as noted in HPI / PMHx or noted below:  Review of Systems  Constitutional: Negative.   HENT: Negative.   Eyes: Negative.   Respiratory: Negative.   Cardiovascular: Negative.   Gastrointestinal: Negative.   Genitourinary: Negative.   Musculoskeletal: Negative.   Skin: Negative.   Neurological: Negative.   Endo/Heme/Allergies: Negative.   Psychiatric/Behavioral: Negative.      Objective:   Vitals:   02/13/17 0930  BP: 140/70  Pulse: 68  Resp: 18          Physical Exam  Constitutional: She is well-developed, well-nourished, and in no distress.  HENT:  Head: Normocephalic.  Right Ear: Tympanic membrane, external ear and ear canal normal.  Left Ear: Tympanic membrane, external ear and ear canal normal.  Nose: Nose normal. No mucosal edema or rhinorrhea.  Mouth/Throat: Uvula is midline, oropharynx is clear and moist and mucous membranes are normal. No oropharyngeal exudate.  Eyes: Conjunctivae are normal.  Neck: Trachea normal. No tracheal tenderness  present. No tracheal deviation present. No thyromegaly present.  Cardiovascular: Normal rate, regular rhythm, S1 normal, S2 normal and normal heart sounds.   No murmur heard. Pulmonary/Chest: Breath sounds normal. No stridor. No respiratory distress. She has no wheezes. She has no rales.  Musculoskeletal: She exhibits no edema.  Lymphadenopathy:       Head (right side): No tonsillar adenopathy present.       Head (left side): No tonsillar adenopathy present.    She has no cervical adenopathy.  Neurological: She is alert. Gait normal.  Skin: Rash (scattered urticarial lesions  affecting arms and dorsal hand right) noted. She is not diaphoretic. No erythema. Nails show no clubbing.  Psychiatric: Mood and affect normal.    Diagnostics: Review blood tests dated 12/07/2016 identified a negative alpha gal panel, negative RF, negative Meredith Escobar, negative SED, negative uric acid, normal hepatic and renal function, white blood cell count 5.5 with normal differential and an absolute eosinophil count of 100, absolute lymphocyte count of 2200, hemoglobin 13.1, platelet 232.  Assessment and Plan:   1. Allergic urticaria   2. Other allergic rhinitis   3. Seasonal allergic conjunctivitis   4. Morning joint stiffness of right hand   5. Morning joint stiffness of hand, left     1. Continue to perform Allergen avoidance measures  2. For eye watering / irritation:   A. Pazeo - one drop each eye one time per day  B. evaluation of eyes with eye doctor  3. Continue a combination of the following:   A. levocetirizine 5 mg tablet at nighttime  B. loratadine 10 mg tablet in morning  4. Continue nasal fluticasone one-2 sprays each nostril 3-7 times per week  5. Can add OTC Benadryl if needed  6. Consider omalizumab / Xolair administration  7. Visit with rheumatologist in evaluation of hand stiffness  8. Return to clinic in 12 weeks or earlier if problem  Meredith Escobar still has a active immune system and for her eye condition we will have her use a topical antihistamine and also have her evaluated by a ophthalmologist or optometrist to make sure we are not dealing with dry eye syndrome. For her hand stiffness we will refer her onto a rheumatologist to see if there is any further indication for therapy regarding this issue. She has the option of using omalizumab for her urticaria but at this point she is not particularly interested in undergoing this form of treatment. If she does well I will see her back in this clinic in 12 weeks or earlier if there is a problem.  Meredith Katz, MD Allergy  / Immunology [place]

## 2017-02-13 NOTE — Patient Instructions (Addendum)
  1. Continue to perform Allergen avoidance measures  2. For eye watering / irritation:   A. Pazeo - one drop each eye one time per day  B. evaluation of eyes with eye doctor  3. Continue a combination of the following:   A. levocetirizine 5 mg tablet at nighttime  B. loratadine 10 mg tablet in morning  4. Continue nasal fluticasone one-2 sprays each nostril 3-7 times per week  5. Can add OTC Benadryl if needed  6. Consider omalizumab / Xolair administration  7. Visit with rheumatologist in evaluation of hand stiffness  8. Return to clinic in 12 weeks or earlier if problem

## 2017-02-16 DIAGNOSIS — L509 Urticaria, unspecified: Secondary | ICD-10-CM | POA: Diagnosis not present

## 2017-02-16 DIAGNOSIS — K279 Peptic ulcer, site unspecified, unspecified as acute or chronic, without hemorrhage or perforation: Secondary | ICD-10-CM | POA: Diagnosis not present

## 2017-02-16 DIAGNOSIS — M25649 Stiffness of unspecified hand, not elsewhere classified: Secondary | ICD-10-CM | POA: Diagnosis not present

## 2017-02-16 DIAGNOSIS — H04302 Unspecified dacryocystitis of left lacrimal passage: Secondary | ICD-10-CM | POA: Diagnosis not present

## 2017-02-16 DIAGNOSIS — J302 Other seasonal allergic rhinitis: Secondary | ICD-10-CM | POA: Diagnosis not present

## 2017-03-05 DIAGNOSIS — H2513 Age-related nuclear cataract, bilateral: Secondary | ICD-10-CM | POA: Diagnosis not present

## 2017-03-05 DIAGNOSIS — H25013 Cortical age-related cataract, bilateral: Secondary | ICD-10-CM | POA: Diagnosis not present

## 2017-03-06 ENCOUNTER — Other Ambulatory Visit (HOSPITAL_BASED_OUTPATIENT_CLINIC_OR_DEPARTMENT_OTHER): Payer: Self-pay | Admitting: Family Medicine

## 2017-03-06 ENCOUNTER — Ambulatory Visit (HOSPITAL_BASED_OUTPATIENT_CLINIC_OR_DEPARTMENT_OTHER)
Admission: RE | Admit: 2017-03-06 | Discharge: 2017-03-06 | Disposition: A | Discharge status: Home / Self Care | Payer: PPO | Source: Ambulatory Visit | Attending: Family Medicine | Admitting: Family Medicine

## 2017-03-06 DIAGNOSIS — M7989 Other specified soft tissue disorders: Secondary | ICD-10-CM | POA: Insufficient documentation

## 2017-03-06 DIAGNOSIS — M79604 Pain in right leg: Secondary | ICD-10-CM | POA: Diagnosis not present

## 2017-03-08 ENCOUNTER — Other Ambulatory Visit: Payer: Self-pay | Admitting: Orthopedic Surgery

## 2017-03-08 DIAGNOSIS — M7121 Synovial cyst of popliteal space [Baker], right knee: Secondary | ICD-10-CM

## 2017-03-20 ENCOUNTER — Ambulatory Visit
Admission: RE | Admit: 2017-03-20 | Discharge: 2017-03-20 | Disposition: A | Discharge status: Home / Self Care | Payer: PPO | Source: Ambulatory Visit | Attending: Orthopedic Surgery | Admitting: Orthopedic Surgery

## 2017-03-20 DIAGNOSIS — M7122 Synovial cyst of popliteal space [Baker], left knee: Secondary | ICD-10-CM | POA: Diagnosis not present

## 2017-03-20 DIAGNOSIS — M7121 Synovial cyst of popliteal space [Baker], right knee: Secondary | ICD-10-CM

## 2017-03-21 ENCOUNTER — Other Ambulatory Visit (HOSPITAL_COMMUNITY)
Admit: 2017-03-21 | Discharge: 2017-03-21 | Disposition: A | Discharge status: Home / Self Care | Payer: PPO | Source: Ambulatory Visit | Attending: Interventional Radiology | Admitting: Interventional Radiology

## 2017-03-21 DIAGNOSIS — M7120 Synovial cyst of popliteal space [Baker], unspecified knee: Secondary | ICD-10-CM | POA: Insufficient documentation

## 2017-03-25 LAB — AEROBIC/ANAEROBIC CULTURE W GRAM STAIN (SURGICAL/DEEP WOUND): Culture: NO GROWTH

## 2017-03-25 LAB — AEROBIC/ANAEROBIC CULTURE (SURGICAL/DEEP WOUND)

## 2017-03-30 DIAGNOSIS — M5431 Sciatica, right side: Secondary | ICD-10-CM | POA: Diagnosis not present

## 2017-04-10 DIAGNOSIS — H25011 Cortical age-related cataract, right eye: Secondary | ICD-10-CM | POA: Diagnosis not present

## 2017-04-10 DIAGNOSIS — H2511 Age-related nuclear cataract, right eye: Secondary | ICD-10-CM | POA: Diagnosis not present

## 2017-04-10 DIAGNOSIS — H25811 Combined forms of age-related cataract, right eye: Secondary | ICD-10-CM | POA: Diagnosis not present

## 2017-04-16 DIAGNOSIS — E669 Obesity, unspecified: Secondary | ICD-10-CM | POA: Diagnosis not present

## 2017-04-16 DIAGNOSIS — M6530 Trigger finger, unspecified finger: Secondary | ICD-10-CM | POA: Diagnosis not present

## 2017-04-16 DIAGNOSIS — M25642 Stiffness of left hand, not elsewhere classified: Secondary | ICD-10-CM | POA: Diagnosis not present

## 2017-04-16 DIAGNOSIS — M25641 Stiffness of right hand, not elsewhere classified: Secondary | ICD-10-CM | POA: Diagnosis not present

## 2017-04-16 DIAGNOSIS — Z6830 Body mass index (BMI) 30.0-30.9, adult: Secondary | ICD-10-CM | POA: Diagnosis not present

## 2017-05-08 DIAGNOSIS — M25642 Stiffness of left hand, not elsewhere classified: Secondary | ICD-10-CM | POA: Diagnosis not present

## 2017-05-08 DIAGNOSIS — R030 Elevated blood-pressure reading, without diagnosis of hypertension: Secondary | ICD-10-CM | POA: Diagnosis not present

## 2017-05-08 DIAGNOSIS — L509 Urticaria, unspecified: Secondary | ICD-10-CM | POA: Diagnosis not present

## 2017-05-08 DIAGNOSIS — E639 Nutritional deficiency, unspecified: Secondary | ICD-10-CM | POA: Diagnosis not present

## 2017-05-08 DIAGNOSIS — M25641 Stiffness of right hand, not elsewhere classified: Secondary | ICD-10-CM | POA: Diagnosis not present

## 2017-05-08 DIAGNOSIS — E039 Hypothyroidism, unspecified: Secondary | ICD-10-CM | POA: Diagnosis not present

## 2017-05-08 DIAGNOSIS — E7800 Pure hypercholesterolemia, unspecified: Secondary | ICD-10-CM | POA: Diagnosis not present

## 2017-05-08 DIAGNOSIS — M7989 Other specified soft tissue disorders: Secondary | ICD-10-CM | POA: Diagnosis not present

## 2017-05-08 DIAGNOSIS — R7303 Prediabetes: Secondary | ICD-10-CM | POA: Diagnosis not present

## 2017-05-08 DIAGNOSIS — E559 Vitamin D deficiency, unspecified: Secondary | ICD-10-CM | POA: Diagnosis not present

## 2017-05-08 DIAGNOSIS — H60542 Acute eczematoid otitis externa, left ear: Secondary | ICD-10-CM | POA: Diagnosis not present

## 2017-05-08 DIAGNOSIS — E538 Deficiency of other specified B group vitamins: Secondary | ICD-10-CM | POA: Diagnosis not present

## 2017-05-15 DIAGNOSIS — B009 Herpesviral infection, unspecified: Secondary | ICD-10-CM | POA: Diagnosis not present

## 2017-05-15 DIAGNOSIS — R030 Elevated blood-pressure reading, without diagnosis of hypertension: Secondary | ICD-10-CM | POA: Diagnosis not present

## 2017-05-15 DIAGNOSIS — R61 Generalized hyperhidrosis: Secondary | ICD-10-CM | POA: Diagnosis not present

## 2017-05-15 DIAGNOSIS — M25642 Stiffness of left hand, not elsewhere classified: Secondary | ICD-10-CM | POA: Diagnosis not present

## 2017-05-15 DIAGNOSIS — L509 Urticaria, unspecified: Secondary | ICD-10-CM | POA: Diagnosis not present

## 2017-05-15 DIAGNOSIS — Z780 Asymptomatic menopausal state: Secondary | ICD-10-CM | POA: Diagnosis not present

## 2017-05-15 DIAGNOSIS — M25641 Stiffness of right hand, not elsewhere classified: Secondary | ICD-10-CM | POA: Diagnosis not present

## 2017-05-15 DIAGNOSIS — E039 Hypothyroidism, unspecified: Secondary | ICD-10-CM | POA: Diagnosis not present

## 2017-05-15 DIAGNOSIS — E7800 Pure hypercholesterolemia, unspecified: Secondary | ICD-10-CM | POA: Diagnosis not present

## 2017-05-15 DIAGNOSIS — R7303 Prediabetes: Secondary | ICD-10-CM | POA: Diagnosis not present

## 2017-06-12 DIAGNOSIS — Z1231 Encounter for screening mammogram for malignant neoplasm of breast: Secondary | ICD-10-CM | POA: Diagnosis not present

## 2017-06-19 DIAGNOSIS — H25812 Combined forms of age-related cataract, left eye: Secondary | ICD-10-CM | POA: Diagnosis not present

## 2017-06-19 DIAGNOSIS — H2512 Age-related nuclear cataract, left eye: Secondary | ICD-10-CM | POA: Diagnosis not present

## 2017-06-19 DIAGNOSIS — H25012 Cortical age-related cataract, left eye: Secondary | ICD-10-CM | POA: Diagnosis not present

## 2017-07-18 DIAGNOSIS — L508 Other urticaria: Secondary | ICD-10-CM | POA: Diagnosis not present

## 2017-07-19 DIAGNOSIS — Z01419 Encounter for gynecological examination (general) (routine) without abnormal findings: Secondary | ICD-10-CM | POA: Diagnosis not present

## 2017-07-19 DIAGNOSIS — R87610 Atypical squamous cells of undetermined significance on cytologic smear of cervix (ASC-US): Secondary | ICD-10-CM | POA: Diagnosis not present

## 2017-07-30 DIAGNOSIS — M8588 Other specified disorders of bone density and structure, other site: Secondary | ICD-10-CM | POA: Diagnosis not present

## 2017-07-30 DIAGNOSIS — Z780 Asymptomatic menopausal state: Secondary | ICD-10-CM | POA: Diagnosis not present

## 2017-08-28 DIAGNOSIS — Z780 Asymptomatic menopausal state: Secondary | ICD-10-CM | POA: Diagnosis not present

## 2017-08-28 DIAGNOSIS — R030 Elevated blood-pressure reading, without diagnosis of hypertension: Secondary | ICD-10-CM | POA: Diagnosis not present

## 2017-08-28 DIAGNOSIS — L608 Other nail disorders: Secondary | ICD-10-CM | POA: Diagnosis not present

## 2017-08-28 DIAGNOSIS — R739 Hyperglycemia, unspecified: Secondary | ICD-10-CM | POA: Diagnosis not present

## 2017-09-27 DIAGNOSIS — E7800 Pure hypercholesterolemia, unspecified: Secondary | ICD-10-CM | POA: Diagnosis not present

## 2017-09-27 DIAGNOSIS — R7303 Prediabetes: Secondary | ICD-10-CM | POA: Diagnosis not present

## 2017-09-27 DIAGNOSIS — E039 Hypothyroidism, unspecified: Secondary | ICD-10-CM | POA: Diagnosis not present

## 2017-10-02 DIAGNOSIS — E7800 Pure hypercholesterolemia, unspecified: Secondary | ICD-10-CM | POA: Diagnosis not present

## 2017-10-02 DIAGNOSIS — K219 Gastro-esophageal reflux disease without esophagitis: Secondary | ICD-10-CM | POA: Diagnosis not present

## 2017-10-02 DIAGNOSIS — E039 Hypothyroidism, unspecified: Secondary | ICD-10-CM | POA: Diagnosis not present

## 2017-10-02 DIAGNOSIS — J302 Other seasonal allergic rhinitis: Secondary | ICD-10-CM | POA: Diagnosis not present

## 2017-10-02 DIAGNOSIS — R030 Elevated blood-pressure reading, without diagnosis of hypertension: Secondary | ICD-10-CM | POA: Diagnosis not present

## 2017-10-02 DIAGNOSIS — R7303 Prediabetes: Secondary | ICD-10-CM | POA: Diagnosis not present

## 2017-10-02 DIAGNOSIS — H6983 Other specified disorders of Eustachian tube, bilateral: Secondary | ICD-10-CM | POA: Diagnosis not present

## 2017-10-03 DIAGNOSIS — M533 Sacrococcygeal disorders, not elsewhere classified: Secondary | ICD-10-CM | POA: Diagnosis not present

## 2017-10-05 DIAGNOSIS — M9905 Segmental and somatic dysfunction of pelvic region: Secondary | ICD-10-CM | POA: Diagnosis not present

## 2017-10-05 DIAGNOSIS — Q72812 Congenital shortening of left lower limb: Secondary | ICD-10-CM | POA: Diagnosis not present

## 2017-10-05 DIAGNOSIS — M9903 Segmental and somatic dysfunction of lumbar region: Secondary | ICD-10-CM | POA: Diagnosis not present

## 2017-10-05 DIAGNOSIS — M545 Low back pain: Secondary | ICD-10-CM | POA: Diagnosis not present

## 2017-10-08 DIAGNOSIS — M9903 Segmental and somatic dysfunction of lumbar region: Secondary | ICD-10-CM | POA: Diagnosis not present

## 2017-10-08 DIAGNOSIS — M545 Low back pain: Secondary | ICD-10-CM | POA: Diagnosis not present

## 2017-10-08 DIAGNOSIS — M9905 Segmental and somatic dysfunction of pelvic region: Secondary | ICD-10-CM | POA: Diagnosis not present

## 2017-10-08 DIAGNOSIS — Q72812 Congenital shortening of left lower limb: Secondary | ICD-10-CM | POA: Diagnosis not present

## 2017-10-09 DIAGNOSIS — M545 Low back pain: Secondary | ICD-10-CM | POA: Diagnosis not present

## 2017-10-09 DIAGNOSIS — Q72812 Congenital shortening of left lower limb: Secondary | ICD-10-CM | POA: Diagnosis not present

## 2017-10-09 DIAGNOSIS — M9903 Segmental and somatic dysfunction of lumbar region: Secondary | ICD-10-CM | POA: Diagnosis not present

## 2017-10-09 DIAGNOSIS — M9905 Segmental and somatic dysfunction of pelvic region: Secondary | ICD-10-CM | POA: Diagnosis not present

## 2017-10-11 DIAGNOSIS — Q72812 Congenital shortening of left lower limb: Secondary | ICD-10-CM | POA: Diagnosis not present

## 2017-10-11 DIAGNOSIS — M9905 Segmental and somatic dysfunction of pelvic region: Secondary | ICD-10-CM | POA: Diagnosis not present

## 2017-10-11 DIAGNOSIS — M545 Low back pain: Secondary | ICD-10-CM | POA: Diagnosis not present

## 2017-10-11 DIAGNOSIS — M9903 Segmental and somatic dysfunction of lumbar region: Secondary | ICD-10-CM | POA: Diagnosis not present

## 2017-10-15 DIAGNOSIS — M9905 Segmental and somatic dysfunction of pelvic region: Secondary | ICD-10-CM | POA: Diagnosis not present

## 2017-10-15 DIAGNOSIS — Q72812 Congenital shortening of left lower limb: Secondary | ICD-10-CM | POA: Diagnosis not present

## 2017-10-15 DIAGNOSIS — M9903 Segmental and somatic dysfunction of lumbar region: Secondary | ICD-10-CM | POA: Diagnosis not present

## 2017-10-15 DIAGNOSIS — M545 Low back pain: Secondary | ICD-10-CM | POA: Diagnosis not present

## 2017-10-16 DIAGNOSIS — M545 Low back pain: Secondary | ICD-10-CM | POA: Diagnosis not present

## 2017-10-16 DIAGNOSIS — Q72812 Congenital shortening of left lower limb: Secondary | ICD-10-CM | POA: Diagnosis not present

## 2017-10-16 DIAGNOSIS — M9903 Segmental and somatic dysfunction of lumbar region: Secondary | ICD-10-CM | POA: Diagnosis not present

## 2017-10-16 DIAGNOSIS — M9905 Segmental and somatic dysfunction of pelvic region: Secondary | ICD-10-CM | POA: Diagnosis not present

## 2017-10-18 DIAGNOSIS — M9905 Segmental and somatic dysfunction of pelvic region: Secondary | ICD-10-CM | POA: Diagnosis not present

## 2017-10-18 DIAGNOSIS — M545 Low back pain: Secondary | ICD-10-CM | POA: Diagnosis not present

## 2017-10-18 DIAGNOSIS — Q72812 Congenital shortening of left lower limb: Secondary | ICD-10-CM | POA: Diagnosis not present

## 2017-10-18 DIAGNOSIS — M9903 Segmental and somatic dysfunction of lumbar region: Secondary | ICD-10-CM | POA: Diagnosis not present

## 2017-10-20 IMAGING — US US EXTREM LOW VENOUS*R*
1 series · 13 of 24 positions shown · non-contrast
Comparison: None.

CLINICAL DATA: Initial evaluation for acute right lower leg pain
and swelling.



[Series 1: us extrem low venous*right* · 0.08mm/px · 13 of 40 slices shown]
[im 1/40]
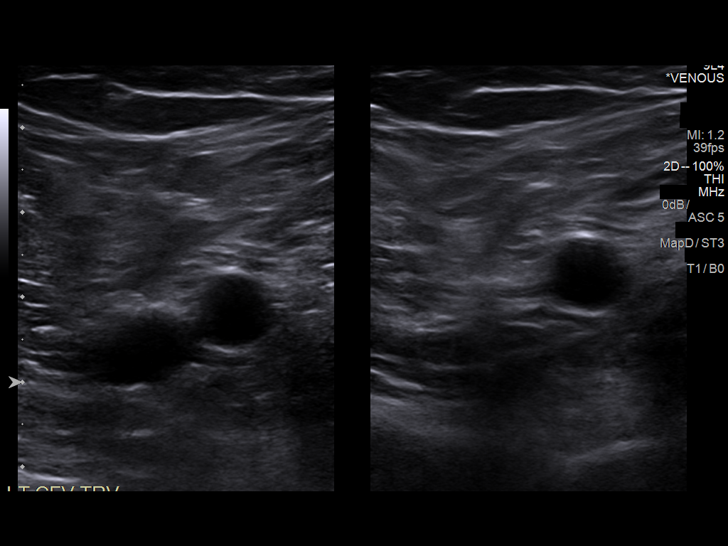
[im 4/40]
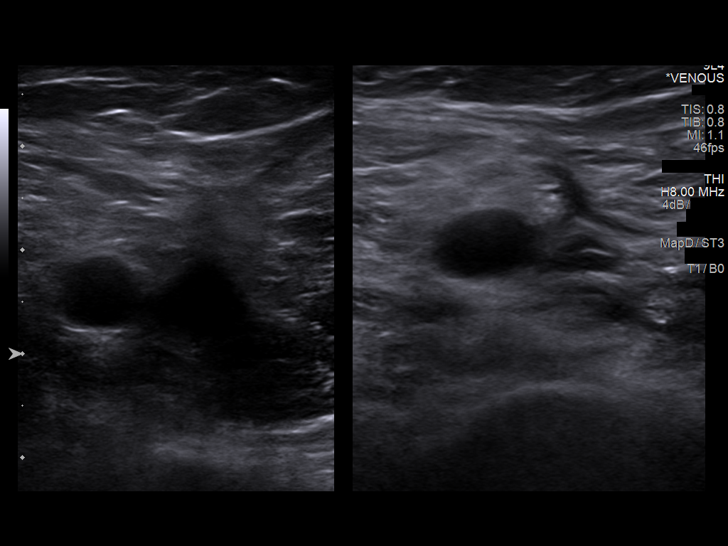
[im 7/40]
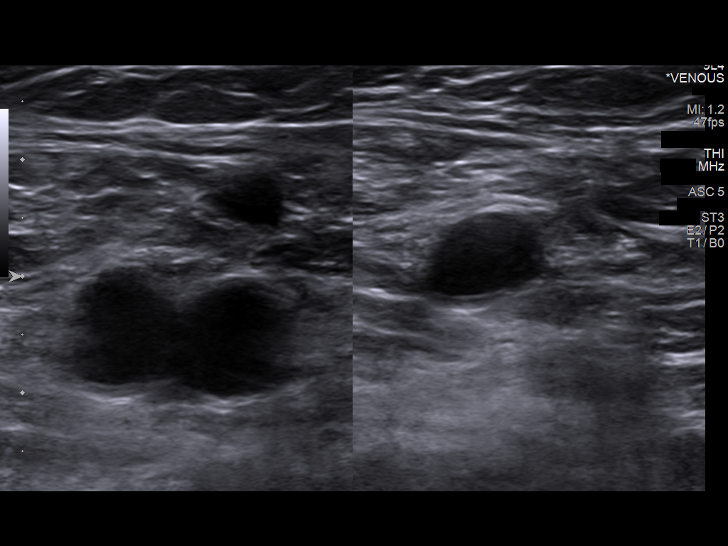
[im 11/40]
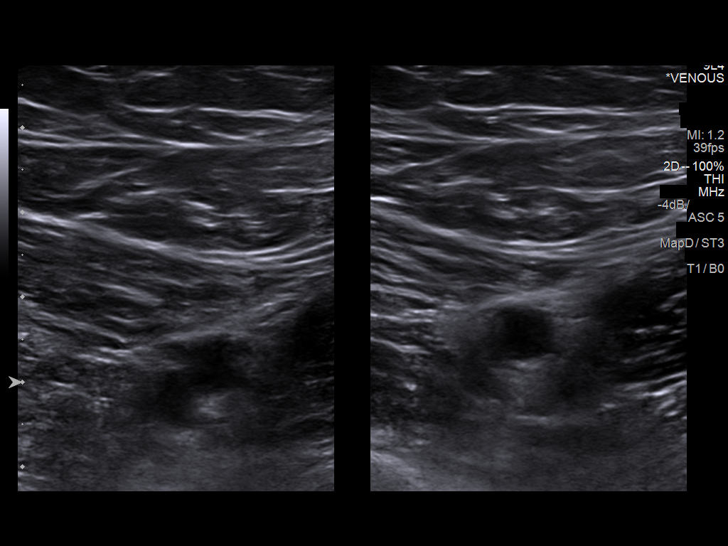
[im 14/40]
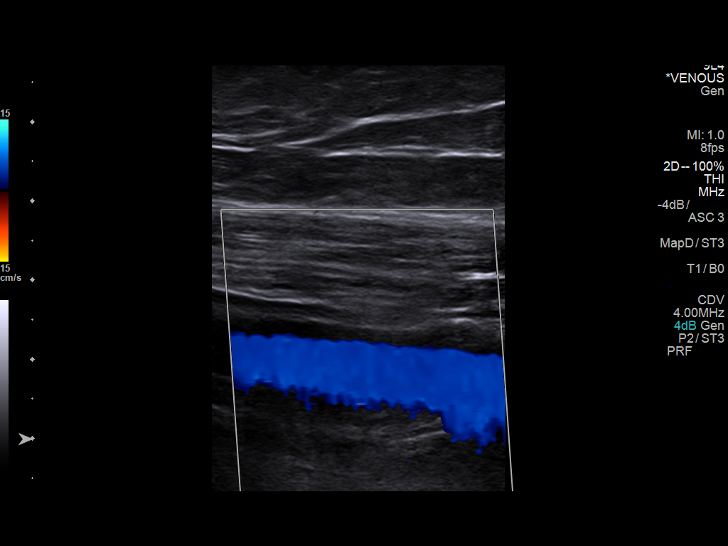
[im 17/40]
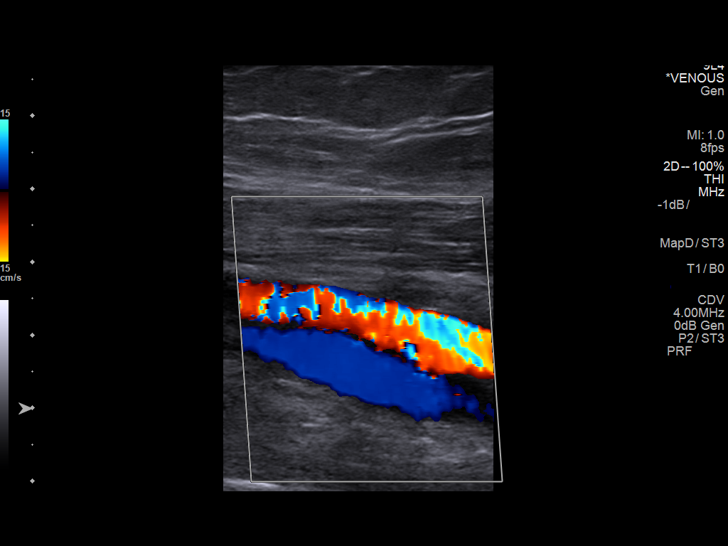
[im 21/40]
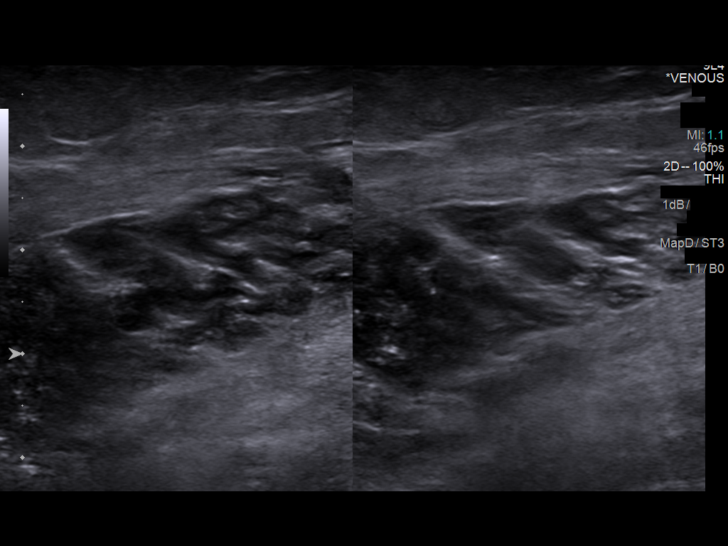
[im 23/40]
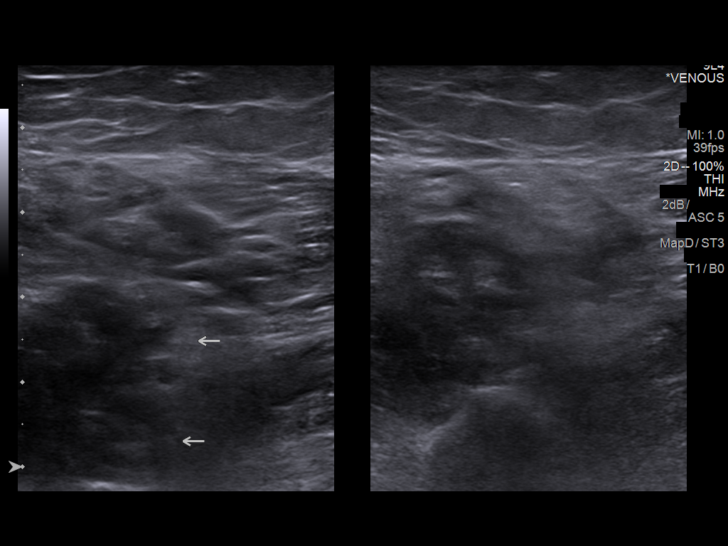
[im 26/40]
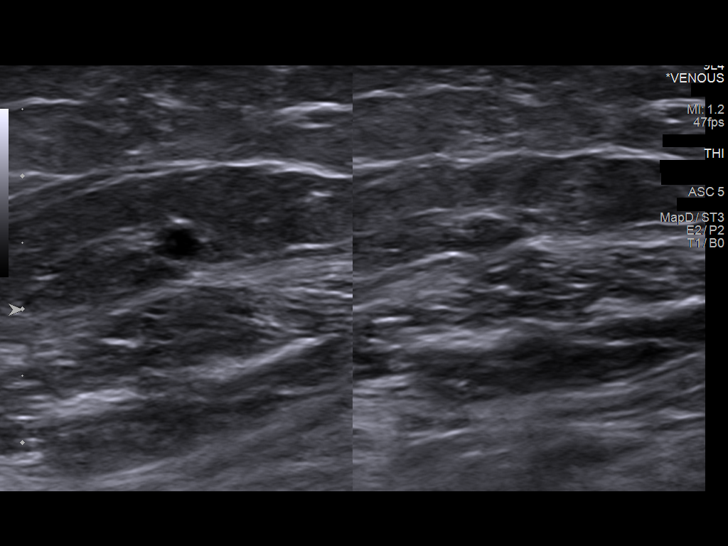
[im 29/40]
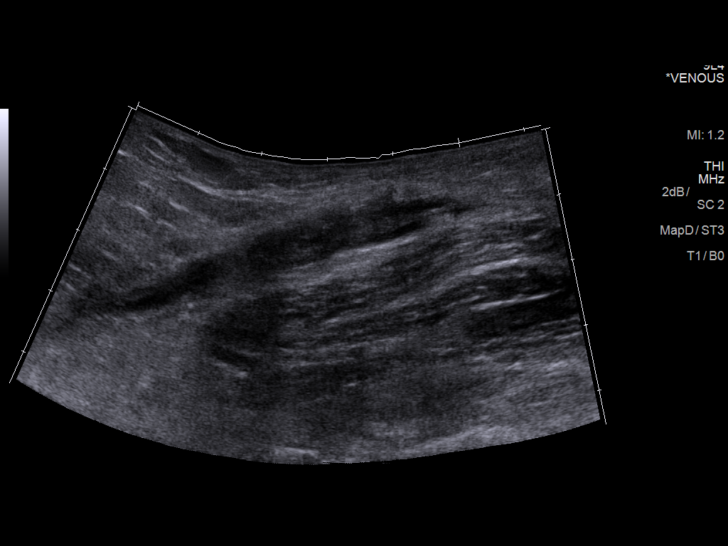
[im 33/40]
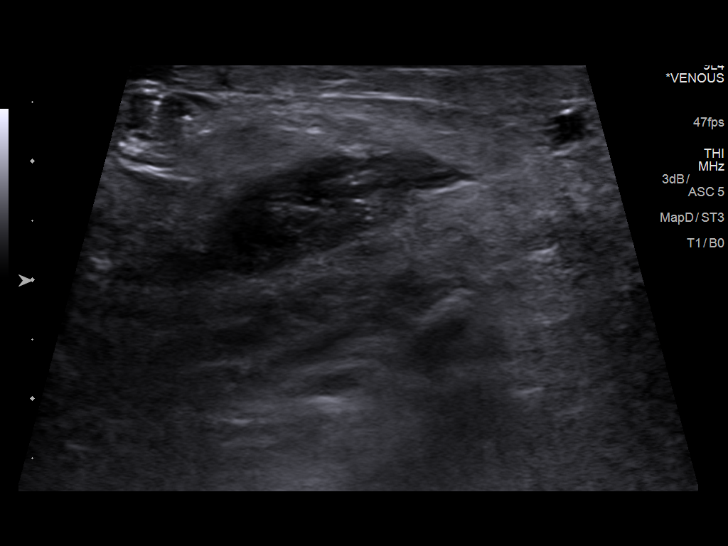
[im 36/40]
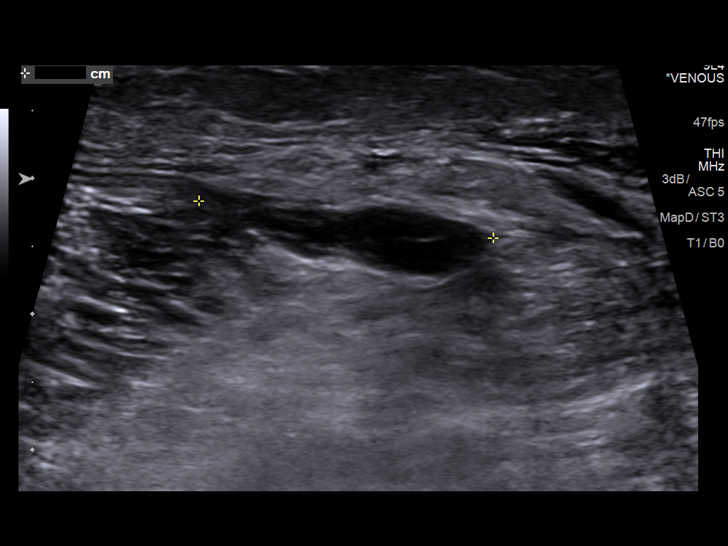
[im 40/40]
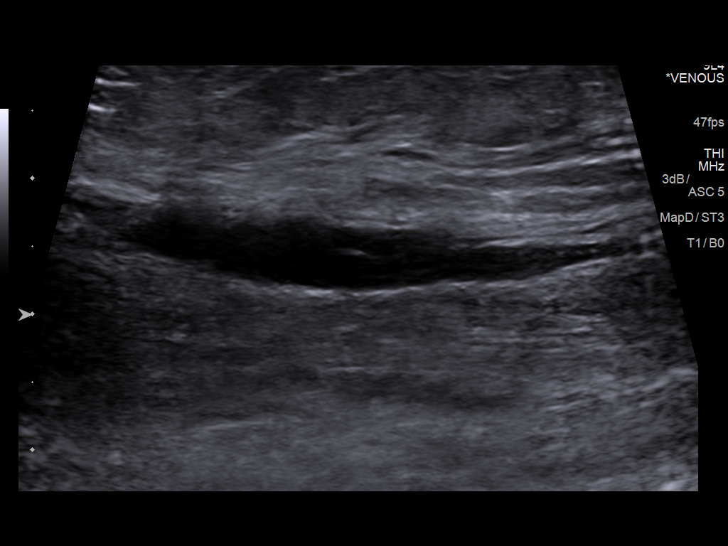

[13 of 24 positions shown; findings below may reference images not displayed]

FINDINGS: Contralateral Common Femoral Vein: Respiratory phasicity is normal
and symmetric with the symptomatic side. No evidence of thrombus.
Normal compressibility.

Common Femoral Vein: No evidence of thrombus. Normal
compressibility, respiratory phasicity and response to augmentation.

Saphenofemoral Junction: No evidence of thrombus. Normal
compressibility and flow on color Doppler imaging.

Profunda Femoral Vein: No evidence of thrombus. Normal
compressibility and flow on color Doppler imaging.

Femoral Vein: No evidence of thrombus. Normal compressibility,
respiratory phasicity and response to augmentation.

Popliteal Vein: No evidence of thrombus. Normal compressibility,
respiratory phasicity and response to augmentation.

Calf Veins: No evidence of thrombus. Normal compressibility and flow
on color Doppler imaging.

Superficial Great Saphenous Vein: No evidence of thrombus. Normal
compressibility and flow on color Doppler imaging.

Venous Reflux:  None.

Other Findings: Complex hypoechoic fluid collection within the mid
right popliteal fossa measures 6.2 x 0.7 x 2.3 cm. Additional 3.9 x
0.6 x 2.2 cm hypoechoic complex collection within the posterior
aspect of the superior right calf. These likely reflect Baker cyst.
IMPRESSION: 1. No evidence of DVT within the right lower extremity.
2. Two separate complex fluid collections within the right popliteal
fossa and posterior aspect of the superior right calf as above,
likely Baker's cysts.

## 2017-10-22 DIAGNOSIS — M545 Low back pain: Secondary | ICD-10-CM | POA: Diagnosis not present

## 2017-10-22 DIAGNOSIS — M9905 Segmental and somatic dysfunction of pelvic region: Secondary | ICD-10-CM | POA: Diagnosis not present

## 2017-10-22 DIAGNOSIS — M9903 Segmental and somatic dysfunction of lumbar region: Secondary | ICD-10-CM | POA: Diagnosis not present

## 2017-10-22 DIAGNOSIS — Q72812 Congenital shortening of left lower limb: Secondary | ICD-10-CM | POA: Diagnosis not present

## 2017-10-23 DIAGNOSIS — M9903 Segmental and somatic dysfunction of lumbar region: Secondary | ICD-10-CM | POA: Diagnosis not present

## 2017-10-23 DIAGNOSIS — Q72812 Congenital shortening of left lower limb: Secondary | ICD-10-CM | POA: Diagnosis not present

## 2017-10-23 DIAGNOSIS — M9905 Segmental and somatic dysfunction of pelvic region: Secondary | ICD-10-CM | POA: Diagnosis not present

## 2017-10-23 DIAGNOSIS — M545 Low back pain: Secondary | ICD-10-CM | POA: Diagnosis not present

## 2017-10-25 DIAGNOSIS — M9903 Segmental and somatic dysfunction of lumbar region: Secondary | ICD-10-CM | POA: Diagnosis not present

## 2017-10-25 DIAGNOSIS — Q72812 Congenital shortening of left lower limb: Secondary | ICD-10-CM | POA: Diagnosis not present

## 2017-10-25 DIAGNOSIS — M545 Low back pain: Secondary | ICD-10-CM | POA: Diagnosis not present

## 2017-10-25 DIAGNOSIS — M9905 Segmental and somatic dysfunction of pelvic region: Secondary | ICD-10-CM | POA: Diagnosis not present

## 2017-10-29 DIAGNOSIS — M545 Low back pain: Secondary | ICD-10-CM | POA: Diagnosis not present

## 2017-10-29 DIAGNOSIS — Q72812 Congenital shortening of left lower limb: Secondary | ICD-10-CM | POA: Diagnosis not present

## 2017-10-29 DIAGNOSIS — M9905 Segmental and somatic dysfunction of pelvic region: Secondary | ICD-10-CM | POA: Diagnosis not present

## 2017-10-29 DIAGNOSIS — M9903 Segmental and somatic dysfunction of lumbar region: Secondary | ICD-10-CM | POA: Diagnosis not present

## 2017-11-03 IMAGING — US US ASPIRATION
1 series · 13 of 16 positions shown · non-contrast
Comparison: Right lower extremity venous Doppler ultrasound -
03/06/2017;

INDICATION: History of right medial compartment knee joint replacement, now with
transient episode of posterior knee pain and edema.

DVT ultrasound performed 03/06/2017 was negative for evidence of a
DVT however demonstrated two collections posterior to the knee
compatible with Shakirah cysts.
Patient presents today for ultrasound-guided Baker cyst aspiration
and steroid injection.
EXAM:
US ASPIRATION

[Series 1: us aspiration · 0.06mm/px · 19 acquisitions, 13 frames shown]
[im 1/19]
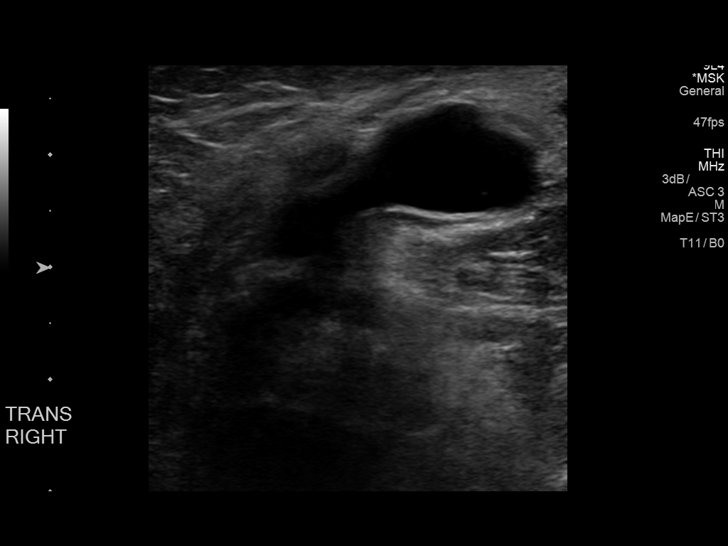
[im 2/19]
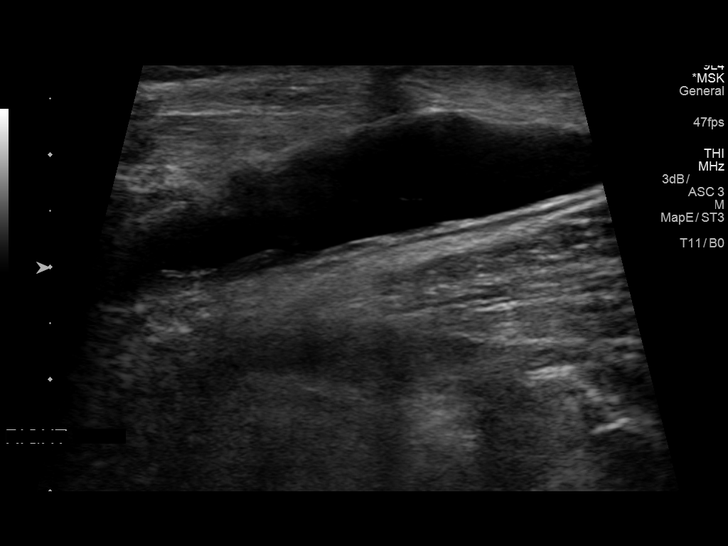
[im 4/19]
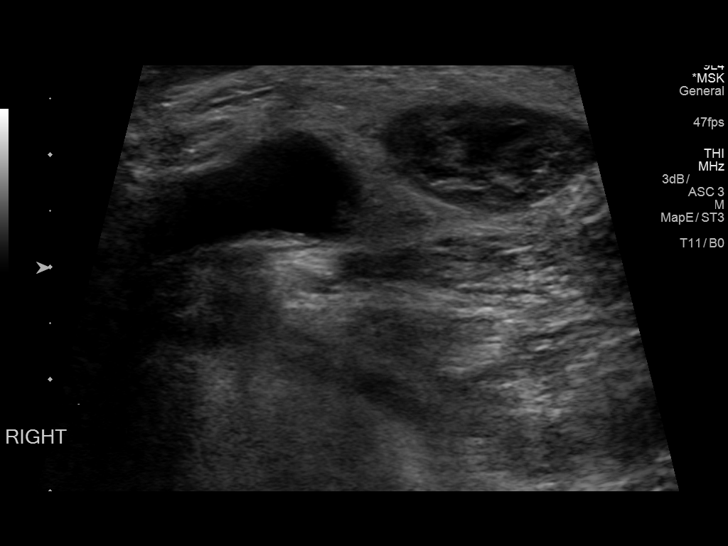
[im 5/19]
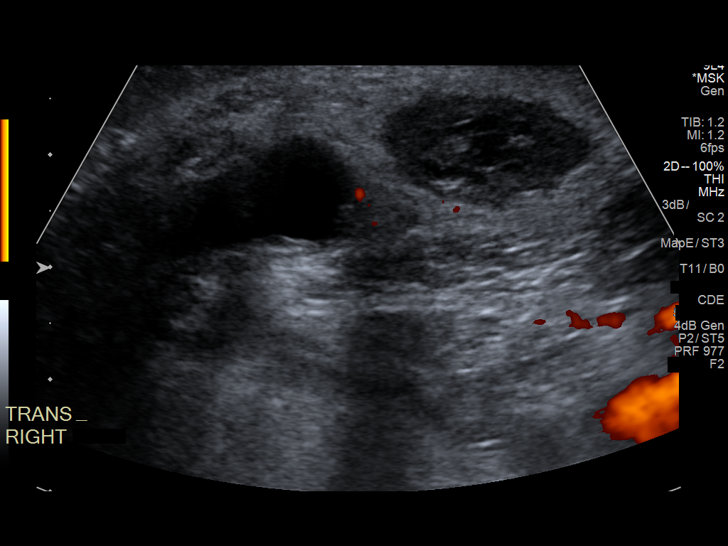
[im 7/19]
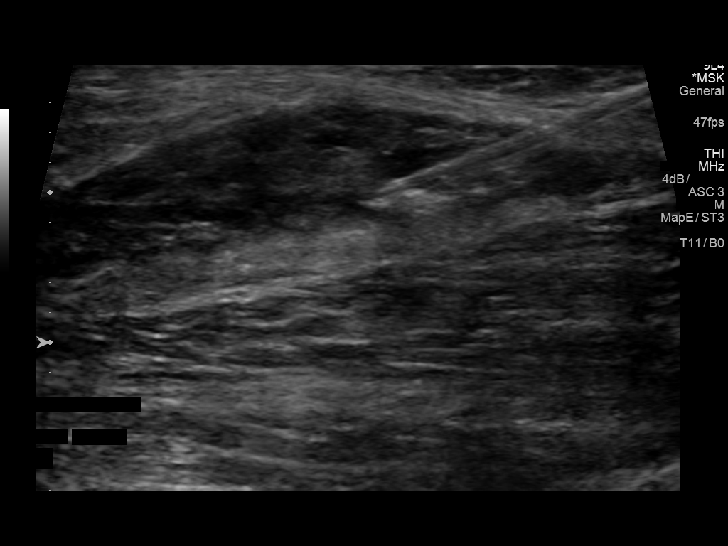
[im 8/19]
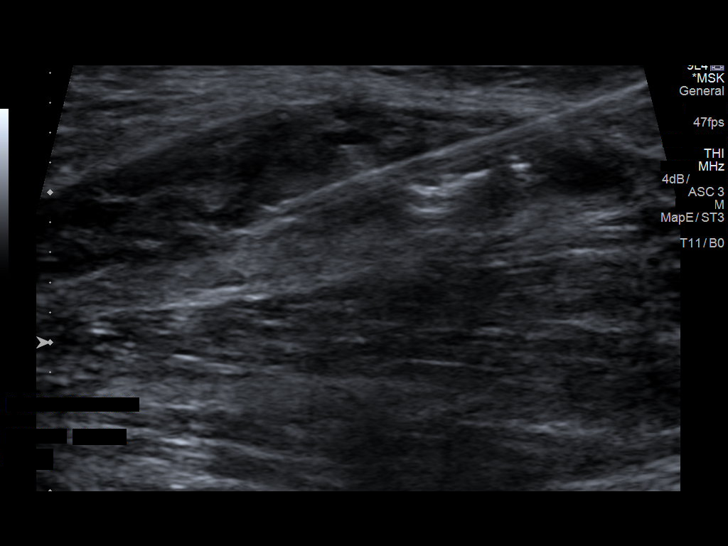
[im 10/19]
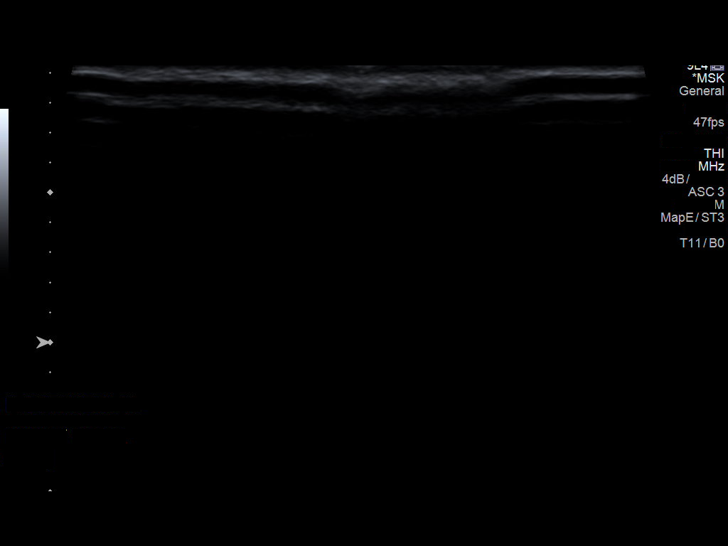
[im 11/19]
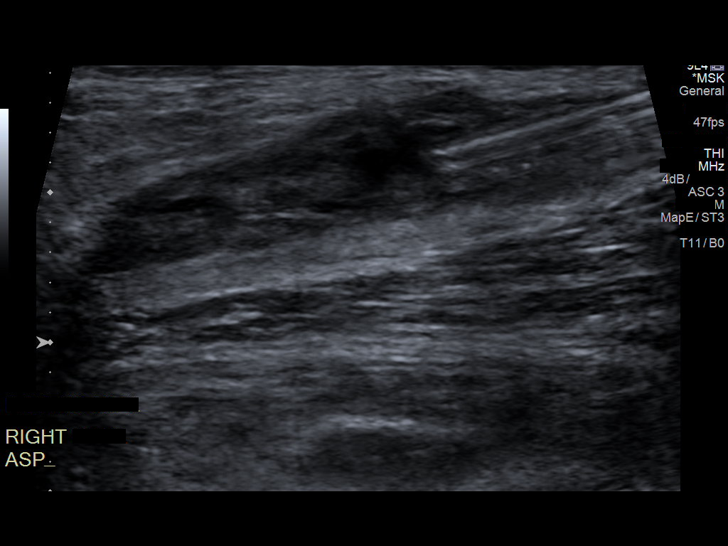
[im 13/19]
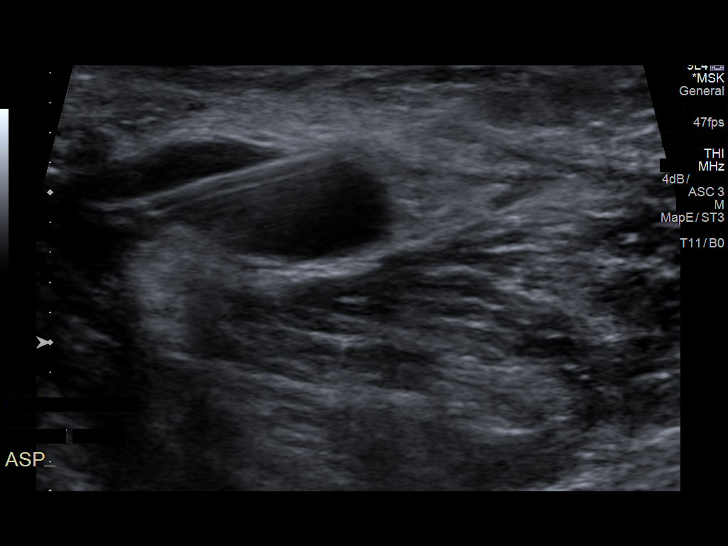
[im 14/19]
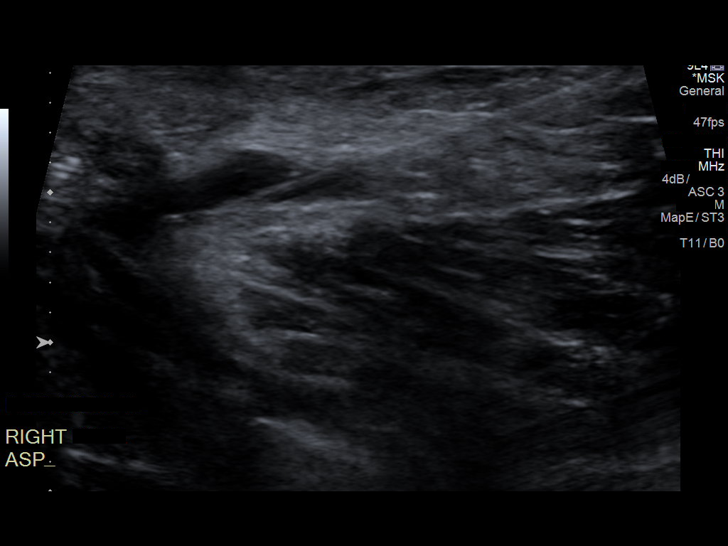
[im 15/19]
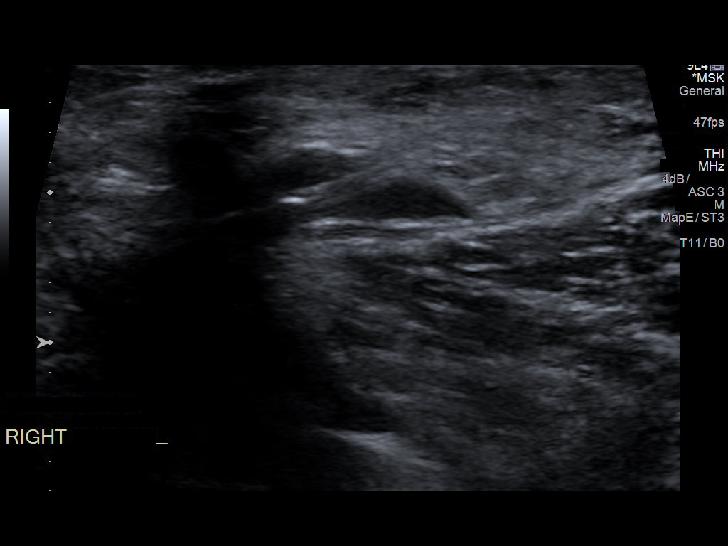
[im 17/19]
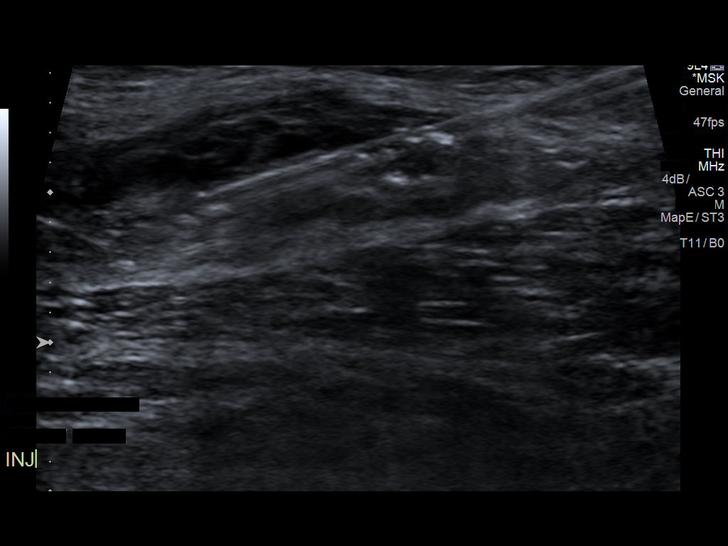
[im 19/19]
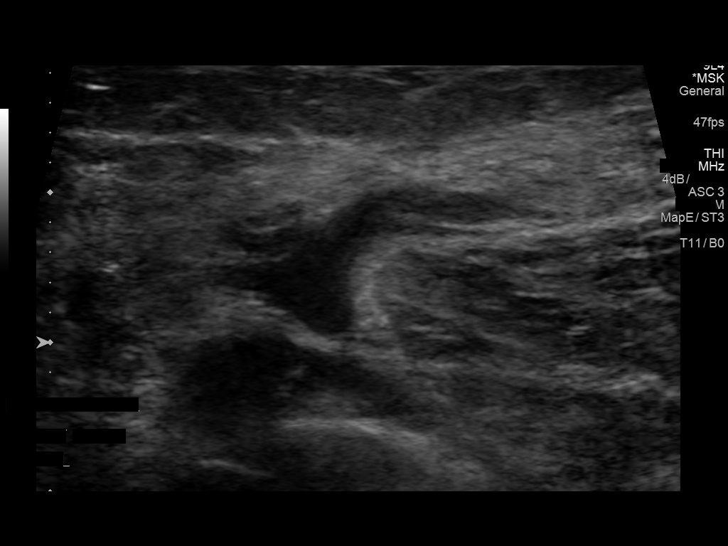

[13 of 16 positions shown; findings below may reference images not displayed]

right knee radiographs - 03/06/2017

MEDICATIONS:
120 mg Depo-Medrol diluted in 5 ml of 0.5% bupivacaine

ANESTHESIA/SEDATION:
None

CONTRAST:  None

COMPLICATIONS:
None immediate.

PROCEDURE:
Informed written consent was obtained from the patient after a
discussion of the risks, benefits and alternatives to treatment. The
patient was placed prone on the ultrasound table with preprocedural
imaging demonstrating grossly unchanged appearance of the moderate
sized bilobed Baker cyst with one component appearing anechoic and
cystic and the additional component containing internal echogenic
debris.

The skin overlying the posterior aspect of the left knee was prepped
and draped in usual sterile fashion. The overlying soft tissues were
anesthetized with 1% lidocaine.

An 18 gauge needle was advanced into the dominant component
containing internal echogenic debris, however only approximately
cc of bloody fluid was able to be aspirated.

Next, the 18 gauge needle was utilized to dominate anechoic / cystic
component and approximately 4 cc of serous joint fluid was
aspirated.

Next, a total of 120 mg Depo-Medrol diluted in 5 ml of 0.5%
bupivacaine was administered into both components of the Baker's
cyst.

The needle was removed and superficial hemostasis was achieved with
manual compression. A dressing was placed. The patient tolerated the
procedure well without immediate postprocedural complication.
IMPRESSION: Successful ultrasound-guided steroid injection and aspiration
approximately a total of 5 cc of slightly blood tinged joint fluid
from both components of the bilobed Baker cyst.

All aspirated fluid was capped and sent to the laboratory for
analysis.

## 2017-11-15 DIAGNOSIS — M545 Low back pain: Secondary | ICD-10-CM | POA: Diagnosis not present

## 2017-11-15 DIAGNOSIS — M9905 Segmental and somatic dysfunction of pelvic region: Secondary | ICD-10-CM | POA: Diagnosis not present

## 2017-11-15 DIAGNOSIS — M9903 Segmental and somatic dysfunction of lumbar region: Secondary | ICD-10-CM | POA: Diagnosis not present

## 2017-11-15 DIAGNOSIS — Q72812 Congenital shortening of left lower limb: Secondary | ICD-10-CM | POA: Diagnosis not present

## 2017-11-20 DIAGNOSIS — Q72812 Congenital shortening of left lower limb: Secondary | ICD-10-CM | POA: Diagnosis not present

## 2017-11-20 DIAGNOSIS — M545 Low back pain: Secondary | ICD-10-CM | POA: Diagnosis not present

## 2017-11-20 DIAGNOSIS — M9903 Segmental and somatic dysfunction of lumbar region: Secondary | ICD-10-CM | POA: Diagnosis not present

## 2017-11-20 DIAGNOSIS — M9905 Segmental and somatic dysfunction of pelvic region: Secondary | ICD-10-CM | POA: Diagnosis not present

## 2017-11-29 DIAGNOSIS — Q72812 Congenital shortening of left lower limb: Secondary | ICD-10-CM | POA: Diagnosis not present

## 2017-11-29 DIAGNOSIS — M545 Low back pain: Secondary | ICD-10-CM | POA: Diagnosis not present

## 2017-11-29 DIAGNOSIS — M9905 Segmental and somatic dysfunction of pelvic region: Secondary | ICD-10-CM | POA: Diagnosis not present

## 2017-11-29 DIAGNOSIS — M9903 Segmental and somatic dysfunction of lumbar region: Secondary | ICD-10-CM | POA: Diagnosis not present

## 2017-12-03 DIAGNOSIS — M6530 Trigger finger, unspecified finger: Secondary | ICD-10-CM | POA: Diagnosis not present

## 2017-12-03 DIAGNOSIS — J309 Allergic rhinitis, unspecified: Secondary | ICD-10-CM | POA: Diagnosis not present

## 2017-12-03 DIAGNOSIS — M461 Sacroiliitis, not elsewhere classified: Secondary | ICD-10-CM | POA: Diagnosis not present

## 2017-12-03 DIAGNOSIS — E039 Hypothyroidism, unspecified: Secondary | ICD-10-CM | POA: Diagnosis not present

## 2017-12-04 DIAGNOSIS — M461 Sacroiliitis, not elsewhere classified: Secondary | ICD-10-CM | POA: Diagnosis not present

## 2017-12-04 DIAGNOSIS — M47817 Spondylosis without myelopathy or radiculopathy, lumbosacral region: Secondary | ICD-10-CM | POA: Diagnosis not present

## 2017-12-18 DIAGNOSIS — M65341 Trigger finger, right ring finger: Secondary | ICD-10-CM | POA: Diagnosis not present

## 2017-12-18 DIAGNOSIS — M79641 Pain in right hand: Secondary | ICD-10-CM | POA: Diagnosis not present

## 2017-12-21 DIAGNOSIS — M461 Sacroiliitis, not elsewhere classified: Secondary | ICD-10-CM | POA: Diagnosis not present

## 2018-01-15 DIAGNOSIS — M65341 Trigger finger, right ring finger: Secondary | ICD-10-CM | POA: Diagnosis not present

## 2018-01-28 DIAGNOSIS — R030 Elevated blood-pressure reading, without diagnosis of hypertension: Secondary | ICD-10-CM | POA: Diagnosis not present

## 2018-01-28 DIAGNOSIS — E7800 Pure hypercholesterolemia, unspecified: Secondary | ICD-10-CM | POA: Diagnosis not present

## 2018-01-28 DIAGNOSIS — R5383 Other fatigue: Secondary | ICD-10-CM | POA: Diagnosis not present

## 2018-01-28 DIAGNOSIS — J309 Allergic rhinitis, unspecified: Secondary | ICD-10-CM | POA: Diagnosis not present

## 2018-01-28 DIAGNOSIS — L918 Other hypertrophic disorders of the skin: Secondary | ICD-10-CM | POA: Diagnosis not present

## 2018-01-28 DIAGNOSIS — M5430 Sciatica, unspecified side: Secondary | ICD-10-CM | POA: Diagnosis not present

## 2018-01-28 DIAGNOSIS — M461 Sacroiliitis, not elsewhere classified: Secondary | ICD-10-CM | POA: Diagnosis not present

## 2018-01-28 DIAGNOSIS — M65341 Trigger finger, right ring finger: Secondary | ICD-10-CM | POA: Diagnosis not present

## 2018-01-28 DIAGNOSIS — R7303 Prediabetes: Secondary | ICD-10-CM | POA: Diagnosis not present

## 2018-01-28 DIAGNOSIS — E039 Hypothyroidism, unspecified: Secondary | ICD-10-CM | POA: Diagnosis not present

## 2018-02-07 DIAGNOSIS — R0609 Other forms of dyspnea: Secondary | ICD-10-CM | POA: Diagnosis not present

## 2018-02-07 DIAGNOSIS — R5383 Other fatigue: Secondary | ICD-10-CM | POA: Diagnosis not present

## 2018-02-07 DIAGNOSIS — R030 Elevated blood-pressure reading, without diagnosis of hypertension: Secondary | ICD-10-CM | POA: Diagnosis not present

## 2018-02-07 DIAGNOSIS — R5381 Other malaise: Secondary | ICD-10-CM | POA: Diagnosis not present

## 2018-03-07 DIAGNOSIS — M79662 Pain in left lower leg: Secondary | ICD-10-CM | POA: Diagnosis not present

## 2018-03-07 DIAGNOSIS — R030 Elevated blood-pressure reading, without diagnosis of hypertension: Secondary | ICD-10-CM | POA: Diagnosis not present

## 2018-03-07 DIAGNOSIS — M79609 Pain in unspecified limb: Secondary | ICD-10-CM | POA: Diagnosis not present

## 2018-03-11 ENCOUNTER — Other Ambulatory Visit (HOSPITAL_COMMUNITY): Payer: Self-pay | Admitting: Family Medicine

## 2018-03-11 ENCOUNTER — Other Ambulatory Visit: Payer: Self-pay | Admitting: Family Medicine

## 2018-03-11 ENCOUNTER — Ambulatory Visit (HOSPITAL_COMMUNITY)
Admission: RE | Admit: 2018-03-11 | Discharge: 2018-03-11 | Disposition: A | Discharge status: Home / Self Care | Payer: PPO | Source: Ambulatory Visit | Attending: Family Medicine | Admitting: Family Medicine

## 2018-03-11 DIAGNOSIS — M79605 Pain in left leg: Secondary | ICD-10-CM | POA: Diagnosis not present

## 2018-03-11 DIAGNOSIS — M7989 Other specified soft tissue disorders: Secondary | ICD-10-CM | POA: Diagnosis not present

## 2018-03-11 DIAGNOSIS — M79662 Pain in left lower leg: Secondary | ICD-10-CM

## 2018-03-11 NOTE — Progress Notes (Signed)
Left lower extremity enous duplex completed. There is no evidence of a DVT or Baker's cyst. Toma Copier, RVS 03/11/2018 2:43 PM

## 2018-03-18 DIAGNOSIS — R5381 Other malaise: Secondary | ICD-10-CM | POA: Diagnosis not present

## 2018-03-18 DIAGNOSIS — R5383 Other fatigue: Secondary | ICD-10-CM | POA: Diagnosis not present

## 2018-03-18 DIAGNOSIS — R0609 Other forms of dyspnea: Secondary | ICD-10-CM | POA: Diagnosis not present

## 2018-03-18 DIAGNOSIS — R9431 Abnormal electrocardiogram [ECG] [EKG]: Secondary | ICD-10-CM | POA: Diagnosis not present

## 2018-03-19 DIAGNOSIS — H1013 Acute atopic conjunctivitis, bilateral: Secondary | ICD-10-CM | POA: Diagnosis not present

## 2018-04-18 DIAGNOSIS — E7800 Pure hypercholesterolemia, unspecified: Secondary | ICD-10-CM | POA: Diagnosis not present

## 2018-04-18 DIAGNOSIS — R030 Elevated blood-pressure reading, without diagnosis of hypertension: Secondary | ICD-10-CM | POA: Diagnosis not present

## 2018-04-18 DIAGNOSIS — E039 Hypothyroidism, unspecified: Secondary | ICD-10-CM | POA: Diagnosis not present

## 2018-04-18 DIAGNOSIS — R7303 Prediabetes: Secondary | ICD-10-CM | POA: Diagnosis not present

## 2018-04-18 DIAGNOSIS — I10 Essential (primary) hypertension: Secondary | ICD-10-CM | POA: Diagnosis not present

## 2018-04-24 DIAGNOSIS — R0609 Other forms of dyspnea: Secondary | ICD-10-CM | POA: Diagnosis not present

## 2018-04-24 DIAGNOSIS — R0989 Other specified symptoms and signs involving the circulatory and respiratory systems: Secondary | ICD-10-CM | POA: Diagnosis not present

## 2018-04-26 DIAGNOSIS — I10 Essential (primary) hypertension: Secondary | ICD-10-CM | POA: Diagnosis not present

## 2018-04-26 DIAGNOSIS — E039 Hypothyroidism, unspecified: Secondary | ICD-10-CM | POA: Diagnosis not present

## 2018-04-26 DIAGNOSIS — E7800 Pure hypercholesterolemia, unspecified: Secondary | ICD-10-CM | POA: Diagnosis not present

## 2018-04-26 DIAGNOSIS — R7303 Prediabetes: Secondary | ICD-10-CM | POA: Diagnosis not present

## 2018-04-26 DIAGNOSIS — R030 Elevated blood-pressure reading, without diagnosis of hypertension: Secondary | ICD-10-CM | POA: Diagnosis not present

## 2018-04-29 DIAGNOSIS — R0609 Other forms of dyspnea: Secondary | ICD-10-CM | POA: Diagnosis not present

## 2018-04-29 DIAGNOSIS — R5381 Other malaise: Secondary | ICD-10-CM | POA: Diagnosis not present

## 2018-04-29 DIAGNOSIS — I10 Essential (primary) hypertension: Secondary | ICD-10-CM | POA: Diagnosis not present

## 2018-04-29 DIAGNOSIS — R5383 Other fatigue: Secondary | ICD-10-CM | POA: Diagnosis not present

## 2018-06-07 DIAGNOSIS — L989 Disorder of the skin and subcutaneous tissue, unspecified: Secondary | ICD-10-CM | POA: Diagnosis not present

## 2018-06-07 DIAGNOSIS — E039 Hypothyroidism, unspecified: Secondary | ICD-10-CM | POA: Diagnosis not present

## 2018-06-07 DIAGNOSIS — E7800 Pure hypercholesterolemia, unspecified: Secondary | ICD-10-CM | POA: Diagnosis not present

## 2018-06-07 DIAGNOSIS — I10 Essential (primary) hypertension: Secondary | ICD-10-CM | POA: Diagnosis not present

## 2018-06-07 DIAGNOSIS — R7309 Other abnormal glucose: Secondary | ICD-10-CM | POA: Diagnosis not present

## 2018-07-10 DIAGNOSIS — Z23 Encounter for immunization: Secondary | ICD-10-CM | POA: Diagnosis not present

## 2018-07-10 DIAGNOSIS — L309 Dermatitis, unspecified: Secondary | ICD-10-CM | POA: Diagnosis not present

## 2018-08-07 ENCOUNTER — Ambulatory Visit: Payer: PPO | Admitting: Podiatry

## 2018-08-07 ENCOUNTER — Ambulatory Visit (INDEPENDENT_AMBULATORY_CARE_PROVIDER_SITE_OTHER): Payer: PPO

## 2018-08-07 ENCOUNTER — Encounter: Payer: Self-pay | Admitting: Podiatry

## 2018-08-07 ENCOUNTER — Other Ambulatory Visit: Payer: Self-pay | Admitting: Podiatry

## 2018-08-07 VITALS — BP 138/67 | HR 76 | Resp 16

## 2018-08-07 DIAGNOSIS — M778 Other enthesopathies, not elsewhere classified: Secondary | ICD-10-CM

## 2018-08-07 DIAGNOSIS — M2041 Other hammer toe(s) (acquired), right foot: Secondary | ICD-10-CM

## 2018-08-07 DIAGNOSIS — M779 Enthesopathy, unspecified: Principal | ICD-10-CM

## 2018-08-07 DIAGNOSIS — M2011 Hallux valgus (acquired), right foot: Secondary | ICD-10-CM

## 2018-08-07 DIAGNOSIS — M2012 Hallux valgus (acquired), left foot: Secondary | ICD-10-CM | POA: Diagnosis not present

## 2018-08-07 DIAGNOSIS — L84 Corns and callosities: Secondary | ICD-10-CM | POA: Diagnosis not present

## 2018-08-07 MED ORDER — TRIAMCINOLONE ACETONIDE 10 MG/ML IJ SUSP
10.0000 mg | Freq: Once | INTRAMUSCULAR | Status: AC
  Administered 2018-08-07: 10 mg

## 2018-08-07 NOTE — Progress Notes (Signed)
   Subjective:    Patient ID: Meredith Escobar, female    DOB: 12-26-1940, 77 y.o.   MRN: [id number]  HPI    Review of Systems  All other systems reviewed and are negative.      Objective:   Physical Exam        Assessment & Plan:

## 2018-08-07 NOTE — Progress Notes (Signed)
Subjective:   Patient ID: Meredith Escobar, female   DOB: 77 y.o.   MRN: [id number]   HPI Patient presents stating that the fifth digit right has really been bothering her and also some pain on the fourth toe.  States that she is tried wider shoes she is tried trimming and its not working but it is only been around a month but it is been hurting and she also has bunion deformities.  Patient does not smoke and likes to be active   Review of Systems  All other systems reviewed and are negative.       Objective:  Physical Exam  Constitutional: She appears well-developed and well-nourished.  Cardiovascular: Intact distal pulses.  Pulmonary/Chest: Effort normal.  Musculoskeletal: Normal range of motion.  Neurological: She is alert.  Skin: Skin is warm.  Nursing note and vitals reviewed.   Neurovascular status found to be intact muscle strength found to be adequate range of motion was within normal limits.  Patient's found to have structural bunion deformity bilateral digital deformities with rotation of the fifth digits and is found to have an inflamed inner phalangeal joint right fifth toe with keratotic lesion formation and fluid buildup associated with it.  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  Sterile prep applied to the toe and I injected the interphalangeal joint with 1 mg dexamethasone 1 mg Kenalog and did sterile sharp debridement of lesion.  I applied sterile dressing applied cushion and instructed on wider shoes and if symptoms persist may require exostectomy  X-rays indicate there is rotation of the fifth digit with spur formation digit 5 right and structural bunion deformity  bilateral with digital deformities

## 2018-08-28 DIAGNOSIS — Z01419 Encounter for gynecological examination (general) (routine) without abnormal findings: Secondary | ICD-10-CM | POA: Diagnosis not present

## 2018-08-28 DIAGNOSIS — Z1231 Encounter for screening mammogram for malignant neoplasm of breast: Secondary | ICD-10-CM | POA: Diagnosis not present

## 2018-09-02 DIAGNOSIS — R7303 Prediabetes: Secondary | ICD-10-CM | POA: Diagnosis not present

## 2018-09-02 DIAGNOSIS — E039 Hypothyroidism, unspecified: Secondary | ICD-10-CM | POA: Diagnosis not present

## 2018-09-02 DIAGNOSIS — H6592 Unspecified nonsuppurative otitis media, left ear: Secondary | ICD-10-CM | POA: Diagnosis not present

## 2018-09-02 DIAGNOSIS — H9012 Conductive hearing loss, unilateral, left ear, with unrestricted hearing on the contralateral side: Secondary | ICD-10-CM | POA: Diagnosis not present

## 2018-09-02 DIAGNOSIS — I10 Essential (primary) hypertension: Secondary | ICD-10-CM | POA: Diagnosis not present

## 2018-09-02 DIAGNOSIS — E7800 Pure hypercholesterolemia, unspecified: Secondary | ICD-10-CM | POA: Diagnosis not present

## 2018-09-19 DIAGNOSIS — H6123 Impacted cerumen, bilateral: Secondary | ICD-10-CM | POA: Diagnosis not present

## 2018-09-19 DIAGNOSIS — H6983 Other specified disorders of Eustachian tube, bilateral: Secondary | ICD-10-CM | POA: Diagnosis not present

## 2018-09-19 DIAGNOSIS — H93293 Other abnormal auditory perceptions, bilateral: Secondary | ICD-10-CM | POA: Diagnosis not present

## 2018-09-19 DIAGNOSIS — J302 Other seasonal allergic rhinitis: Secondary | ICD-10-CM | POA: Diagnosis not present

## 2018-09-27 DIAGNOSIS — J310 Chronic rhinitis: Secondary | ICD-10-CM | POA: Diagnosis not present

## 2018-09-27 DIAGNOSIS — H6983 Other specified disorders of Eustachian tube, bilateral: Secondary | ICD-10-CM | POA: Diagnosis not present

## 2018-09-27 DIAGNOSIS — H903 Sensorineural hearing loss, bilateral: Secondary | ICD-10-CM | POA: Diagnosis not present

## 2018-10-02 DIAGNOSIS — Z23 Encounter for immunization: Secondary | ICD-10-CM | POA: Diagnosis not present

## 2018-10-02 DIAGNOSIS — L309 Dermatitis, unspecified: Secondary | ICD-10-CM | POA: Diagnosis not present

## 2018-10-11 DIAGNOSIS — Z86010 Personal history of colonic polyps: Secondary | ICD-10-CM | POA: Diagnosis not present

## 2018-10-11 DIAGNOSIS — K625 Hemorrhage of anus and rectum: Secondary | ICD-10-CM | POA: Diagnosis not present

## 2018-10-11 DIAGNOSIS — K219 Gastro-esophageal reflux disease without esophagitis: Secondary | ICD-10-CM | POA: Diagnosis not present

## 2018-11-14 DIAGNOSIS — K449 Diaphragmatic hernia without obstruction or gangrene: Secondary | ICD-10-CM | POA: Diagnosis not present

## 2018-11-14 DIAGNOSIS — R12 Heartburn: Secondary | ICD-10-CM | POA: Diagnosis not present

## 2018-11-14 DIAGNOSIS — D124 Benign neoplasm of descending colon: Secondary | ICD-10-CM | POA: Diagnosis not present

## 2018-11-14 DIAGNOSIS — K219 Gastro-esophageal reflux disease without esophagitis: Secondary | ICD-10-CM | POA: Diagnosis not present

## 2018-11-14 DIAGNOSIS — K259 Gastric ulcer, unspecified as acute or chronic, without hemorrhage or perforation: Secondary | ICD-10-CM | POA: Diagnosis not present

## 2018-11-14 DIAGNOSIS — K2930 Chronic superficial gastritis without bleeding: Secondary | ICD-10-CM | POA: Diagnosis not present

## 2018-11-14 DIAGNOSIS — K2900 Acute gastritis without bleeding: Secondary | ICD-10-CM | POA: Diagnosis not present

## 2018-11-14 DIAGNOSIS — K625 Hemorrhage of anus and rectum: Secondary | ICD-10-CM | POA: Diagnosis not present

## 2018-11-14 DIAGNOSIS — K5730 Diverticulosis of large intestine without perforation or abscess without bleeding: Secondary | ICD-10-CM | POA: Diagnosis not present

## 2018-11-14 DIAGNOSIS — Z86010 Personal history of colonic polyps: Secondary | ICD-10-CM | POA: Diagnosis not present

## 2018-11-14 DIAGNOSIS — K640 First degree hemorrhoids: Secondary | ICD-10-CM | POA: Diagnosis not present

## 2018-11-20 DIAGNOSIS — K2930 Chronic superficial gastritis without bleeding: Secondary | ICD-10-CM | POA: Diagnosis not present

## 2018-12-16 DIAGNOSIS — E039 Hypothyroidism, unspecified: Secondary | ICD-10-CM | POA: Diagnosis not present

## 2018-12-16 DIAGNOSIS — J9601 Acute respiratory failure with hypoxia: Secondary | ICD-10-CM | POA: Diagnosis not present

## 2018-12-16 DIAGNOSIS — E7800 Pure hypercholesterolemia, unspecified: Secondary | ICD-10-CM | POA: Diagnosis not present

## 2018-12-16 DIAGNOSIS — E875 Hyperkalemia: Secondary | ICD-10-CM | POA: Diagnosis not present

## 2018-12-16 DIAGNOSIS — I10 Essential (primary) hypertension: Secondary | ICD-10-CM | POA: Diagnosis not present

## 2018-12-16 DIAGNOSIS — R001 Bradycardia, unspecified: Secondary | ICD-10-CM | POA: Diagnosis not present

## 2018-12-16 DIAGNOSIS — N179 Acute kidney failure, unspecified: Secondary | ICD-10-CM | POA: Diagnosis not present

## 2018-12-16 DIAGNOSIS — F0391 Unspecified dementia with behavioral disturbance: Secondary | ICD-10-CM | POA: Diagnosis not present

## 2018-12-16 DIAGNOSIS — K219 Gastro-esophageal reflux disease without esophagitis: Secondary | ICD-10-CM | POA: Diagnosis not present

## 2018-12-16 DIAGNOSIS — J90 Pleural effusion, not elsewhere classified: Secondary | ICD-10-CM | POA: Diagnosis not present

## 2018-12-16 DIAGNOSIS — I4811 Longstanding persistent atrial fibrillation: Secondary | ICD-10-CM | POA: Diagnosis not present

## 2018-12-16 DIAGNOSIS — R112 Nausea with vomiting, unspecified: Secondary | ICD-10-CM | POA: Diagnosis not present

## 2018-12-16 DIAGNOSIS — K859 Acute pancreatitis without necrosis or infection, unspecified: Secondary | ICD-10-CM | POA: Diagnosis not present

## 2018-12-30 ENCOUNTER — Other Ambulatory Visit: Payer: Self-pay | Admitting: Cardiology

## 2018-12-30 DIAGNOSIS — R0989 Other specified symptoms and signs involving the circulatory and respiratory systems: Secondary | ICD-10-CM

## 2019-01-16 DIAGNOSIS — Z20828 Contact with and (suspected) exposure to other viral communicable diseases: Secondary | ICD-10-CM | POA: Diagnosis not present

## 2019-04-03 DIAGNOSIS — M19049 Primary osteoarthritis, unspecified hand: Secondary | ICD-10-CM | POA: Diagnosis not present

## 2019-04-03 DIAGNOSIS — M779 Enthesopathy, unspecified: Secondary | ICD-10-CM | POA: Diagnosis not present

## 2019-04-03 DIAGNOSIS — H1031 Unspecified acute conjunctivitis, right eye: Secondary | ICD-10-CM | POA: Diagnosis not present

## 2019-04-21 ENCOUNTER — Ambulatory Visit (INDEPENDENT_AMBULATORY_CARE_PROVIDER_SITE_OTHER): Payer: PPO

## 2019-04-21 ENCOUNTER — Other Ambulatory Visit: Payer: Self-pay

## 2019-04-21 DIAGNOSIS — R0989 Other specified symptoms and signs involving the circulatory and respiratory systems: Secondary | ICD-10-CM

## 2019-04-28 ENCOUNTER — Encounter: Payer: Self-pay | Admitting: Cardiology

## 2019-04-28 ENCOUNTER — Other Ambulatory Visit: Payer: Self-pay

## 2019-04-28 ENCOUNTER — Ambulatory Visit (INDEPENDENT_AMBULATORY_CARE_PROVIDER_SITE_OTHER): Payer: PPO | Admitting: Cardiology

## 2019-04-28 ENCOUNTER — Ambulatory Visit: Payer: PPO | Admitting: Cardiology

## 2019-04-28 VITALS — BP 132/76 | HR 60 | Temp 97.8°F | Ht 62.0 in | Wt 178.0 lb

## 2019-04-28 DIAGNOSIS — R0989 Other specified symptoms and signs involving the circulatory and respiratory systems: Secondary | ICD-10-CM

## 2019-04-28 DIAGNOSIS — M79641 Pain in right hand: Secondary | ICD-10-CM | POA: Diagnosis not present

## 2019-04-28 DIAGNOSIS — I10 Essential (primary) hypertension: Secondary | ICD-10-CM

## 2019-04-28 DIAGNOSIS — E7800 Pure hypercholesterolemia, unspecified: Secondary | ICD-10-CM | POA: Diagnosis not present

## 2019-04-28 NOTE — Progress Notes (Signed)
 Primary Physician/Referring:  Wong, Francis P, MD  Patient ID: Meredith Escobar, female    DOB: 09/26/1940, 78 y.o.   MRN: [id number]  No chief complaint on file.  HPI:    Meredith Escobar  is a 78 y.o. Caucasian female with past medical history of hypothyroidism, hypercholesterolemia, prediabetes, hypertension and hyperlipidemia who is reluctant to taking medications.  Nuclear stress test in July 2019 which was nonischemic and echocardiogram revealed normal LV systolic function with mild diastolic dysfunction and carotid duplex scan revealed mild carotid disease. She now presents here for annual visit for hyperlipidemia and carotid disease. Patient has been exercising by way of walking for at least 30-40 minutes every day without any symptoms.  Past Medical History:  Diagnosis Date  . GERD (gastroesophageal reflux disease)   . Hypothyroidism   . Thyroid disease   . Urticaria    Past Surgical History:  Procedure Laterality Date  . APPENDECTOMY  1955  . HAND SURGERY  12/14/2014   Hand palmar abscess deep irrigation and drainage  . KNEE ARTHROSCOPY     Right Knee-1992 and 2007; Left Knee- 2001  . KNEE SURGERY Right 11/16/2009  . LAPAROSCOPIC CHOLECYSTECTOMY  02/2004  . OTHER SURGICAL HISTORY  1988   Hysterectomy  . PATELLECTOMY  11/16/2009   Partial Patellectomy  . SURGERY OF LIP  2003  . TUBAL LIGATION  1980   Social History   Socioeconomic History  . Marital status: Divorced    Spouse name: Not on file  . Number of children: Not on file  . Years of education: Not on file  . Highest education level: Not on file  Occupational History  . Not on file  Social Needs  . Financial resource strain: Not on file  . Food insecurity    Worry: Not on file    Inability: Not on file  . Transportation needs    Medical: Not on file    Non-medical: Not on file  Tobacco Use  . Smoking status: Never Smoker  . Smokeless tobacco: Never Used  Substance and Sexual Activity  . Alcohol use: No   . Drug use: No  . Sexual activity: Not on file  Lifestyle  . Physical activity    Days per week: Not on file    Minutes per session: Not on file  . Stress: Not on file  Relationships  . Social connections    Talks on phone: Not on file    Gets together: Not on file    Attends religious service: Not on file    Active member of club or organization: Not on file    Attends meetings of clubs or organizations: Not on file    Relationship status: Not on file  . Intimate partner violence    Fear of current or ex partner: Not on file    Emotionally abused: Not on file    Physically abused: Not on file    Forced sexual activity: Not on file  Other Topics Concern  . Not on file  Social History Narrative  . Not on file   ROS  Review of Systems  Constitution: Negative for chills, decreased appetite, malaise/fatigue and weight gain.  Cardiovascular: Negative for dyspnea on exertion, leg swelling and syncope.  Respiratory: Negative for shortness of breath.   Endocrine: Negative for cold intolerance.  Hematologic/Lymphatic: Does not bruise/bleed easily.  Musculoskeletal: Negative for joint swelling.  Gastrointestinal: Negative for abdominal pain, anorexia, change in bowel habit, hematochezia and melena.    Neurological: Negative for headaches and light-headedness.  Psychiatric/Behavioral: Negative for depression and substance abuse.  All other systems reviewed and are negative.  Objective  Blood pressure 132/76, pulse 60, temperature 97.8 F (36.6 C), height 5' 2" (1.575 m), weight 178 lb (80.7 kg), SpO2 98 %. Body mass index is 32.56 kg/m.   Physical Exam  Constitutional: She appears well-developed and well-nourished. No distress.  HENT:  Head: Atraumatic.  Eyes: Conjunctivae are normal.  Neck: Neck supple. No JVD present. No thyromegaly present.  Cardiovascular: Normal rate, regular rhythm, normal heart sounds, intact distal pulses and normal pulses. Exam reveals no gallop.  No  murmur heard. Right carotid artery bruit present but otherwise normal physical exam/vascular exam.  No leg edema, no JVD.  Pulmonary/Chest: Effort normal and breath sounds normal.  Abdominal: Soft. Bowel sounds are normal.  Musculoskeletal: Normal range of motion.  Neurological: She is alert.  Skin: Skin is warm and dry.  Psychiatric: She has a normal mood and affect.   Radiology: No results found.  Laboratory examination:   01/29/2018: Hemoglobin A1c 5.8%.  CBC normal.  Creatinine 1.0, EGFR 49/60, potassium 4.8, CMP normal.  Vitamin D 35.4.  Thyroid panel normal.  Cholesterol 228, triglycerides 105, HDL 55, LDL 152.  No results for input(s): NA, K, CL, CO2, GLUCOSE, BUN, CREATININE, CALCIUM, GFRNONAA, GFRAA in the last 8760 hours. CMP Latest Ref Rng & Units 12/05/2015  Glucose 65 - 99 mg/dL 120(H)  BUN 6 - 20 mg/dL 15  Creatinine 0.44 - 1.00 mg/dL 0.90  Sodium 135 - 145 mmol/L 137  Potassium 3.5 - 5.1 mmol/L 4.1  Chloride 101 - 111 mmol/L 100(L)   CBC Latest Ref Rng & Units 12/05/2015  Hemoglobin 12.0 - 15.0 g/dL 14.3  Hematocrit 36.0 - 46.0 % 42.0   Lipid Panel  No results found for: CHOL, TRIG, HDL, CHOLHDL, VLDL, LDLCALC, LDLDIRECT HEMOGLOBIN A1C No results found for: HGBA1C, MPG TSH No results for input(s): TSH in the last 8760 hours. Medications   Current Outpatient Medications  Medication Instructions  . Cyanocobalamin (B-12) 1000 MCG CAPS 1 capsule, Oral, Daily  . D3-1000 1,000 Units, Oral, Daily  . fluticasone (FLONASE) 50 MCG/ACT nasal spray 2 sprays, Each Nare, Daily  . hydrochlorothiazide (MICROZIDE) 12.5 MG capsule 1 capsule, Oral, Daily  . levocetirizine (XYZAL) 5 MG tablet 1 tablet, Oral, Daily at bedtime  . levothyroxine (EUTHYROX) 50 mcg, Oral, Daily before breakfast   Cardiac Studies:   Echocardiogram 04/24/2018: Left ventricle cavity is normal in size. Mild concentric hypertrophy of the left ventricle. Normal global wall motion. Doppler evidence of  grade I (impaired) diastolic dysfunction, normal LAP. Calculated EF 62%. Right atrial cavity is slightly dilated. Moderate tricuspid regurgitation. Estimated pulmonary artery systolic pressure 40 mmHg.  Lexiscan myoview stress test 03/18/2018: 1. The resting electrocardiogram demonstrated normal sinus rhythm, Poor R wave progression. normal resting conduction and no resting arrhythmias. The stress electrocardiogram was non diagnostic for ischemia due to pharmacologic stress. Stress symptoms included dizziness. The stress test was terminated because of end of protocol. 2. Myocardial perfusion imaging is normal. Overall left ventricular systolic function was normal without regional wall motion abnormalities. The left ventricular ejection fraction was 81%. This is a low risk study.  Carotid artery duplex  04/21/2019: Minimal plaque bilateral carotid arteries without hemodynamically significant stenosis.  Antegrade right vertebral artery flow. Antegrade left vertebral artery flow. Compared to the study done on 04/24/2018, bilateral less than 50% stenosis in the ICA is no longer present. Follow up   is appropriate if clinically indicated.  Assessment     ICD-10-CM   1. Bilateral carotid bruits  R09.89 EKG 12-Lead  2. Pure hypercholesterolemia  E78.00   3. Essential hypertension  I10     EKG 04/28/2019: Normal sinus rhythm with rate of 57 bpm, borderline left atrial abnormality, nonspecific sagging ST depressions in inferolateral leads, unchanged from 02/07/2018.  Recommendations:   Presents here for annual visit and follow-up of mild carotid artery stenosis, review of her recent carotid duplex reveals very minimal plaque in bilateral carotid arteries.  She does have hyperlipidemia, but she does not want to be on any medications for the same.  Advised her to use flaxseed flakes and also to try red yeast rice at night.  Physical exam otherwise is unremarkable except for very soft right carotid bruit,  I'll see her back on a p.r.n. basis.   , MD, FACC 04/28/2019, 9:55 AM [facility] Pager: [phone] Office: [phone] If no answer Cell [phone] 

## 2019-04-29 NOTE — Progress Notes (Signed)
Pt aware of results and will call when she needs [place]

## 2019-05-09 DIAGNOSIS — M79641 Pain in right hand: Secondary | ICD-10-CM | POA: Diagnosis not present

## 2019-05-09 DIAGNOSIS — M65341 Trigger finger, right ring finger: Secondary | ICD-10-CM | POA: Diagnosis not present

## 2019-05-09 DIAGNOSIS — M65331 Trigger finger, right middle finger: Secondary | ICD-10-CM | POA: Diagnosis not present

## 2019-06-17 DIAGNOSIS — R7303 Prediabetes: Secondary | ICD-10-CM | POA: Diagnosis not present

## 2019-06-17 DIAGNOSIS — E7800 Pure hypercholesterolemia, unspecified: Secondary | ICD-10-CM | POA: Diagnosis not present

## 2019-06-17 DIAGNOSIS — R030 Elevated blood-pressure reading, without diagnosis of hypertension: Secondary | ICD-10-CM | POA: Diagnosis not present

## 2019-06-17 DIAGNOSIS — E039 Hypothyroidism, unspecified: Secondary | ICD-10-CM | POA: Diagnosis not present

## 2019-06-17 DIAGNOSIS — Z79899 Other long term (current) drug therapy: Secondary | ICD-10-CM | POA: Diagnosis not present

## 2019-06-17 DIAGNOSIS — E639 Nutritional deficiency, unspecified: Secondary | ICD-10-CM | POA: Diagnosis not present

## 2019-06-20 DIAGNOSIS — M79641 Pain in right hand: Secondary | ICD-10-CM | POA: Diagnosis not present

## 2019-06-20 DIAGNOSIS — M65331 Trigger finger, right middle finger: Secondary | ICD-10-CM | POA: Diagnosis not present

## 2019-06-20 DIAGNOSIS — M65341 Trigger finger, right ring finger: Secondary | ICD-10-CM | POA: Diagnosis not present

## 2019-07-15 DIAGNOSIS — E639 Nutritional deficiency, unspecified: Secondary | ICD-10-CM | POA: Diagnosis not present

## 2019-07-15 DIAGNOSIS — E7800 Pure hypercholesterolemia, unspecified: Secondary | ICD-10-CM | POA: Diagnosis not present

## 2019-07-15 DIAGNOSIS — J302 Other seasonal allergic rhinitis: Secondary | ICD-10-CM | POA: Diagnosis not present

## 2019-07-15 DIAGNOSIS — R030 Elevated blood-pressure reading, without diagnosis of hypertension: Secondary | ICD-10-CM | POA: Diagnosis not present

## 2019-07-15 DIAGNOSIS — Z79899 Other long term (current) drug therapy: Secondary | ICD-10-CM | POA: Diagnosis not present

## 2019-07-15 DIAGNOSIS — E039 Hypothyroidism, unspecified: Secondary | ICD-10-CM | POA: Diagnosis not present

## 2019-07-15 DIAGNOSIS — R7303 Prediabetes: Secondary | ICD-10-CM | POA: Diagnosis not present

## 2019-07-15 DIAGNOSIS — H9192 Unspecified hearing loss, left ear: Secondary | ICD-10-CM | POA: Diagnosis not present

## 2019-09-24 ENCOUNTER — Ambulatory Visit: Payer: PPO | Attending: Internal Medicine

## 2019-09-24 DIAGNOSIS — Z20822 Contact with and (suspected) exposure to covid-19: Secondary | ICD-10-CM | POA: Diagnosis not present

## 2019-09-25 LAB — NOVEL CORONAVIRUS, NAA: SARS-CoV-2, NAA: NOT DETECTED

## 2019-10-15 DIAGNOSIS — Z01419 Encounter for gynecological examination (general) (routine) without abnormal findings: Secondary | ICD-10-CM | POA: Diagnosis not present

## 2019-10-15 DIAGNOSIS — Z1231 Encounter for screening mammogram for malignant neoplasm of breast: Secondary | ICD-10-CM | POA: Diagnosis not present

## 2019-10-17 DIAGNOSIS — L739 Follicular disorder, unspecified: Secondary | ICD-10-CM | POA: Diagnosis not present

## 2019-10-23 ENCOUNTER — Other Ambulatory Visit: Payer: Self-pay | Admitting: Family Medicine

## 2019-10-23 DIAGNOSIS — M8580 Other specified disorders of bone density and structure, unspecified site: Secondary | ICD-10-CM

## 2019-11-25 DIAGNOSIS — M65331 Trigger finger, right middle finger: Secondary | ICD-10-CM | POA: Diagnosis not present

## 2019-11-25 DIAGNOSIS — M65341 Trigger finger, right ring finger: Secondary | ICD-10-CM | POA: Diagnosis not present

## 2019-12-05 ENCOUNTER — Ambulatory Visit: Payer: PPO | Attending: Internal Medicine

## 2019-12-05 DIAGNOSIS — Z20822 Contact with and (suspected) exposure to covid-19: Secondary | ICD-10-CM | POA: Diagnosis not present

## 2019-12-06 LAB — SARS-COV-2, NAA 2 DAY TAT

## 2019-12-06 LAB — NOVEL CORONAVIRUS, NAA: SARS-CoV-2, NAA: NOT DETECTED

## 2019-12-18 DIAGNOSIS — R7303 Prediabetes: Secondary | ICD-10-CM | POA: Diagnosis not present

## 2019-12-18 DIAGNOSIS — E039 Hypothyroidism, unspecified: Secondary | ICD-10-CM | POA: Diagnosis not present

## 2019-12-18 DIAGNOSIS — H9192 Unspecified hearing loss, left ear: Secondary | ICD-10-CM | POA: Diagnosis not present

## 2019-12-18 DIAGNOSIS — I10 Essential (primary) hypertension: Secondary | ICD-10-CM | POA: Diagnosis not present

## 2019-12-18 DIAGNOSIS — R030 Elevated blood-pressure reading, without diagnosis of hypertension: Secondary | ICD-10-CM | POA: Diagnosis not present

## 2019-12-18 DIAGNOSIS — J302 Other seasonal allergic rhinitis: Secondary | ICD-10-CM | POA: Diagnosis not present

## 2019-12-18 DIAGNOSIS — Z79899 Other long term (current) drug therapy: Secondary | ICD-10-CM | POA: Diagnosis not present

## 2019-12-18 DIAGNOSIS — E7800 Pure hypercholesterolemia, unspecified: Secondary | ICD-10-CM | POA: Diagnosis not present

## 2020-01-12 ENCOUNTER — Other Ambulatory Visit: Payer: Self-pay

## 2020-01-12 ENCOUNTER — Ambulatory Visit
Admission: RE | Admit: 2020-01-12 | Discharge: 2020-01-12 | Disposition: A | Discharge status: Home / Self Care | Payer: PPO | Source: Ambulatory Visit | Attending: Family Medicine | Admitting: Family Medicine

## 2020-01-12 DIAGNOSIS — Z780 Asymptomatic menopausal state: Secondary | ICD-10-CM | POA: Diagnosis not present

## 2020-01-12 DIAGNOSIS — M8580 Other specified disorders of bone density and structure, unspecified site: Secondary | ICD-10-CM

## 2020-01-12 DIAGNOSIS — M85851 Other specified disorders of bone density and structure, right thigh: Secondary | ICD-10-CM | POA: Diagnosis not present

## 2020-01-19 DIAGNOSIS — H6983 Other specified disorders of Eustachian tube, bilateral: Secondary | ICD-10-CM | POA: Diagnosis not present

## 2020-01-19 DIAGNOSIS — J310 Chronic rhinitis: Secondary | ICD-10-CM | POA: Diagnosis not present

## 2020-01-19 DIAGNOSIS — H9193 Unspecified hearing loss, bilateral: Secondary | ICD-10-CM | POA: Diagnosis not present

## 2020-02-04 DIAGNOSIS — H906 Mixed conductive and sensorineural hearing loss, bilateral: Secondary | ICD-10-CM | POA: Diagnosis not present

## 2020-02-10 ENCOUNTER — Other Ambulatory Visit: Payer: Self-pay

## 2020-02-10 ENCOUNTER — Encounter: Payer: Self-pay | Admitting: Allergy and Immunology

## 2020-02-10 ENCOUNTER — Ambulatory Visit (INDEPENDENT_AMBULATORY_CARE_PROVIDER_SITE_OTHER): Payer: PPO | Admitting: Allergy and Immunology

## 2020-02-10 VITALS — BP 146/60 | HR 67 | Temp 97.8°F | Resp 17 | Ht 62.0 in | Wt 169.4 lb

## 2020-02-10 DIAGNOSIS — J3089 Other allergic rhinitis: Secondary | ICD-10-CM | POA: Diagnosis not present

## 2020-02-10 DIAGNOSIS — A048 Other specified bacterial intestinal infections: Secondary | ICD-10-CM | POA: Diagnosis not present

## 2020-02-10 DIAGNOSIS — L501 Idiopathic urticaria: Secondary | ICD-10-CM | POA: Diagnosis not present

## 2020-02-10 NOTE — Patient Instructions (Addendum)
  1.  Continue levo cetirizine 5 mg tablet-2 tablets in p.m.  2.  Continue nasal fluticasone 1-2 sprays each nostril daily  3.  H. pylori urease breath test  4.  Further evaluation and treatment?  5. Obtain Covid vaccine

## 2020-02-10 NOTE — Progress Notes (Signed)
[facility] [place] [facility] [place]   Follow-up Note  Referring Provider: Vernie Shanks, MD Primary Provider: Vernie Shanks, MD Date of Office Visit: 02/10/2020  Subjective:   Meredith Escobar (DOB: 12-22-1940) is a 79 y.o. female who returns to the [facility] on 02/10/2020 in re-evaluation of the following:  HPI: Tyshea returns to this clinic in evaluation of chronic urticaria and allergic rhinoconjunctivitis and a recent abdominal issue.  Her last visit to this clinic was 13 February 2017.  While consistently using levocetirizine at a dose of 10 mg daily she has had excellent control of her urticaria and no breakthrough reactions.  If she misses her doses for several days then she may develop a solitary urticarial lesion.  Her nose is really doing quite well while using a nasal steroid on a consistent basis.  She had an event about 3 weeks ago with intense abdominal pain and cramping that went on for about a week without any associated vomiting or diarrhea but possibly some nausea.  She has had H. pylori infection on 2 different occasions in the past and she feels as though this episode was much like her previous episodes of H. pylori and she wants to be tested for this condition.  She has been use some omeprazole on a rare occasion although she has not used any omeprazole in 2020.  Allergies as of 02/10/2020      Reactions   Ampicillin Rash   Gabapentin Rash      Medication List      B-12 1000 MCG Caps Take 1 capsule by mouth daily.   D3-1000 25 MCG (1000 UT) capsule Generic drug: Cholecalciferol Take 1,000 Units by mouth daily.   Euthyrox 50 MCG tablet Generic drug: levothyroxine Take 50 mcg by mouth daily before breakfast.   fluticasone 50 MCG/ACT nasal spray Commonly known as: FLONASE Place 2 sprays into both nostrils daily.   hydrochlorothiazide 12.5 MG capsule Commonly known as: MICROZIDE Take 1 capsule by mouth daily.    levocetirizine 5 MG tablet Commonly known as: XYZAL Take 1 tablet by mouth at bedtime.       Past Medical History:  Diagnosis Date  . GERD (gastroesophageal reflux disease)   . Hypothyroidism   . Thyroid disease   . Urticaria     Past Surgical History:  Procedure Laterality Date  . APPENDECTOMY  1955  . HAND SURGERY  12/14/2014   Hand palmar abscess deep irrigation and drainage  . KNEE ARTHROSCOPY     Right Knee-1992 and 2007; Left Knee- 2001  . KNEE SURGERY Right 11/16/2009  . LAPAROSCOPIC CHOLECYSTECTOMY  02/2004  . OTHER SURGICAL HISTORY  1988   Hysterectomy  . PATELLECTOMY  11/16/2009   Partial Patellectomy  . SURGERY OF LIP  2003  . TUBAL LIGATION  1980    Review of systems negative except as noted in HPI / PMHx or noted below:  Review of Systems  Constitutional: Negative.   HENT: Negative.   Eyes: Negative.   Respiratory: Negative.   Cardiovascular: Negative.   Gastrointestinal: Negative.   Genitourinary: Negative.   Musculoskeletal: Negative.   Skin: Negative.   Neurological: Negative.   Endo/Heme/Allergies: Negative.   Psychiatric/Behavioral: Negative.      Objective:   Vitals:   02/10/20 1428  BP: (!) 146/60  Pulse: 67  Resp: 17  Temp: 97.8 F (36.6 C)  SpO2: 97%   Height: 5\' 2"  (157.5 cm)  Weight: 169  lb 6.4 oz (76.8 kg)   Physical Exam Constitutional:      Appearance: She is not diaphoretic.  HENT:     Head: Normocephalic.     Right Ear: Tympanic membrane, ear canal and external ear normal.     Left Ear: Tympanic membrane, ear canal and external ear normal.     Nose: Nose normal. No mucosal edema or rhinorrhea.     Mouth/Throat:     Pharynx: Uvula midline. No oropharyngeal exudate.  Eyes:     Conjunctiva/sclera: Conjunctivae normal.  Neck:     Thyroid: No thyromegaly.     Trachea: Trachea normal. No tracheal tenderness or tracheal deviation.  Cardiovascular:     Rate and Rhythm: Normal rate and regular rhythm.     Heart  sounds: Normal heart sounds, S1 normal and S2 normal. No murmur.  Pulmonary:     Effort: No respiratory distress.     Breath sounds: Normal breath sounds. No stridor. No wheezing or rales.  Lymphadenopathy:     Head:     Right side of head: No tonsillar adenopathy.     Left side of head: No tonsillar adenopathy.     Cervical: No cervical adenopathy.  Skin:    Findings: No erythema or rash.     Nails: There is no clubbing.  Neurological:     Mental Status: She is alert.     Diagnostics: none  Assessment and Plan:   1. Idiopathic urticaria   2. Perennial allergic rhinitis   3. H. pylori infection     1.  Continue levo cetirizine 5 mg tablet-2 tablets in p.m.  2.  Continue nasal fluticasone 1-2 sprays each nostril daily  3.  H. pylori urease breath test  4.  Further evaluation and treatment?  5.  Obtain Covid vaccine  Argie appears to be doing quite well on her current dose of levo cetirizine regarding control of her urticaria and she will continue to utilize this medication.  At this point there is no further need for any evaluation to decipher why her immune system hyperactivity manifested as chronic urticaria given her excellent control while using levocetirizine.  She will continue her nasal steroid for allergic rhinitis.  She would like to be checked for another episode of H.  Pylori for her recent gastrointestinal issue about a month ago and we will obtain a urease breath test.  Allena Katz, MD [facility]

## 2020-02-11 ENCOUNTER — Encounter: Payer: Self-pay | Admitting: Allergy and Immunology

## 2020-02-11 LAB — H. PYLORI BREATH TEST: H pylori Breath Test: NEGATIVE

## 2020-04-15 DIAGNOSIS — H52203 Unspecified astigmatism, bilateral: Secondary | ICD-10-CM | POA: Diagnosis not present

## 2020-04-15 DIAGNOSIS — Z961 Presence of intraocular lens: Secondary | ICD-10-CM | POA: Diagnosis not present

## 2020-04-15 DIAGNOSIS — H524 Presbyopia: Secondary | ICD-10-CM | POA: Diagnosis not present

## 2020-04-15 DIAGNOSIS — H10413 Chronic giant papillary conjunctivitis, bilateral: Secondary | ICD-10-CM | POA: Diagnosis not present

## 2020-04-16 DIAGNOSIS — Z20828 Contact with and (suspected) exposure to other viral communicable diseases: Secondary | ICD-10-CM | POA: Diagnosis not present

## 2020-05-14 ENCOUNTER — Other Ambulatory Visit: Payer: PPO

## 2020-05-14 ENCOUNTER — Other Ambulatory Visit: Payer: Self-pay

## 2020-05-14 DIAGNOSIS — Z20822 Contact with and (suspected) exposure to covid-19: Secondary | ICD-10-CM | POA: Diagnosis not present

## 2020-05-15 LAB — NOVEL CORONAVIRUS, NAA: SARS-CoV-2, NAA: NOT DETECTED

## 2020-05-21 DIAGNOSIS — H0011 Chalazion right upper eyelid: Secondary | ICD-10-CM | POA: Diagnosis not present

## 2020-05-21 DIAGNOSIS — H5711 Ocular pain, right eye: Secondary | ICD-10-CM | POA: Diagnosis not present

## 2020-06-22 DIAGNOSIS — E039 Hypothyroidism, unspecified: Secondary | ICD-10-CM | POA: Diagnosis not present

## 2020-06-22 DIAGNOSIS — I10 Essential (primary) hypertension: Secondary | ICD-10-CM | POA: Diagnosis not present

## 2020-06-22 DIAGNOSIS — E7800 Pure hypercholesterolemia, unspecified: Secondary | ICD-10-CM | POA: Diagnosis not present

## 2020-06-22 DIAGNOSIS — R7303 Prediabetes: Secondary | ICD-10-CM | POA: Diagnosis not present

## 2020-06-22 DIAGNOSIS — Z79899 Other long term (current) drug therapy: Secondary | ICD-10-CM | POA: Diagnosis not present

## 2020-06-22 DIAGNOSIS — H9192 Unspecified hearing loss, left ear: Secondary | ICD-10-CM | POA: Diagnosis not present

## 2020-06-22 DIAGNOSIS — R030 Elevated blood-pressure reading, without diagnosis of hypertension: Secondary | ICD-10-CM | POA: Diagnosis not present

## 2020-06-22 DIAGNOSIS — J302 Other seasonal allergic rhinitis: Secondary | ICD-10-CM | POA: Diagnosis not present

## 2020-07-03 ENCOUNTER — Other Ambulatory Visit: Payer: PPO

## 2020-07-03 DIAGNOSIS — Z20822 Contact with and (suspected) exposure to covid-19: Secondary | ICD-10-CM

## 2020-07-04 LAB — NOVEL CORONAVIRUS, NAA: SARS-CoV-2, NAA: NOT DETECTED

## 2020-07-04 LAB — SARS-COV-2, NAA 2 DAY TAT

## 2020-08-17 DIAGNOSIS — L299 Pruritus, unspecified: Secondary | ICD-10-CM | POA: Diagnosis not present

## 2020-08-17 DIAGNOSIS — H6983 Other specified disorders of Eustachian tube, bilateral: Secondary | ICD-10-CM | POA: Diagnosis not present

## 2020-08-17 DIAGNOSIS — H6122 Impacted cerumen, left ear: Secondary | ICD-10-CM | POA: Diagnosis not present

## 2020-08-17 DIAGNOSIS — H903 Sensorineural hearing loss, bilateral: Secondary | ICD-10-CM | POA: Diagnosis not present

## 2020-08-17 DIAGNOSIS — H938X3 Other specified disorders of ear, bilateral: Secondary | ICD-10-CM | POA: Diagnosis not present

## 2020-08-17 DIAGNOSIS — H6123 Impacted cerumen, bilateral: Secondary | ICD-10-CM | POA: Diagnosis not present

## 2020-08-17 DIAGNOSIS — H6121 Impacted cerumen, right ear: Secondary | ICD-10-CM | POA: Diagnosis not present

## 2020-10-25 DIAGNOSIS — Z6830 Body mass index (BMI) 30.0-30.9, adult: Secondary | ICD-10-CM | POA: Diagnosis not present

## 2020-10-25 DIAGNOSIS — R87620 Atypical squamous cells of undetermined significance on cytologic smear of vagina (ASC-US): Secondary | ICD-10-CM | POA: Diagnosis not present

## 2020-10-25 DIAGNOSIS — Z01419 Encounter for gynecological examination (general) (routine) without abnormal findings: Secondary | ICD-10-CM | POA: Diagnosis not present

## 2020-10-25 DIAGNOSIS — Z01411 Encounter for gynecological examination (general) (routine) with abnormal findings: Secondary | ICD-10-CM | POA: Diagnosis not present

## 2020-10-25 DIAGNOSIS — Z1231 Encounter for screening mammogram for malignant neoplasm of breast: Secondary | ICD-10-CM | POA: Diagnosis not present

## 2020-10-28 ENCOUNTER — Ambulatory Visit: Payer: Medicare Other | Admitting: Audiologist

## 2020-11-01 DIAGNOSIS — Z8719 Personal history of other diseases of the digestive system: Secondary | ICD-10-CM | POA: Diagnosis not present

## 2020-11-01 DIAGNOSIS — Z8619 Personal history of other infectious and parasitic diseases: Secondary | ICD-10-CM | POA: Diagnosis not present

## 2020-11-01 DIAGNOSIS — R1013 Epigastric pain: Secondary | ICD-10-CM | POA: Diagnosis not present

## 2020-11-01 DIAGNOSIS — K2970 Gastritis, unspecified, without bleeding: Secondary | ICD-10-CM | POA: Diagnosis not present

## 2020-11-11 ENCOUNTER — Ambulatory Visit: Payer: Medicare Other | Attending: Family Medicine | Admitting: Audiology

## 2020-11-11 ENCOUNTER — Other Ambulatory Visit: Payer: Self-pay

## 2020-11-11 DIAGNOSIS — H903 Sensorineural hearing loss, bilateral: Secondary | ICD-10-CM | POA: Diagnosis not present

## 2020-11-11 NOTE — Procedures (Signed)
  [facility] [place], [place]  [id number] [phone]  AUDIOLOGICAL  EVALUATION  NAME: DEVORA Escobar     DOB:   October 27, 1940      MRN: [id number]                                                                                     DATE: 11/11/2020     REFERENT: Vernie Shanks, MD STATUS: Outpatient DIAGNOSIS: Sensorineural Hearing Loss, bilateral   History: Meredith Escobar was seen for an audiological evaluation. Meredith Escobar reports decreased hearing occurring for many years, worse in the left ear. She reports decreased hearing due to her history of allergies. [name] is followed by [facility] in [place] for cerumen management and Eustachian tube dysfunction. She had a hearing evaluation in December at which time results were consistent with a mild to moderately-severe sensorineural hearing loss, bilaterally. Meredith Escobar denies otalgia, aural fullness, dizziness, and tinnitus. Meredith Escobar denies concerns regarding her hearing.   Evaluation:   Otoscopy showed a clear view of the tympanic membranes, bilaterally  Tympanometry results were consistent with normal middle ear pressure and normal tympanic membrane mobility in the right ear and normal middle ear pressure and reduced tympanic membrane mobility in the left ear.   Audiometric testing was completed using Conventional Audiometry techniques with insert earphones and TDH headphones. Test results are consistent with a mild to moderately-severe sensorineural hearing loss with a conductive component noted at 250 Hz, bilaterally. Speech Recognition Thresholds were obtained at 35 dB HL in the right ear and at 40 dB HL in the left ear. Word Recognition Testing was completed at 75 dB HL and [name] scored 96%, bilaterally.   Results:  Today's test results are consistent with a mild to moderately-severe sensorineural hearing loss in both ears with a conductive component noted at 250 Hz. Meredith Escobar will have communication difficulty in many  listening environments and will benefit from the use of good communication strategies and the use of amplification. The test results and recommendations were reviewed with [name]. The use of hearing aids was discussed briefly however Meredith Escobar reported she is not interested in pursuing amplification at this time. It was recommended to continue to monitor Meredith Escobar's hearing sensitivity.   Recommendations: 1. Communication Needs Assessment with an Audiologist to discuss hearing aids if patient motivated 2. Monitor Hearing Sensitivity, return in 1-2 years for an audiological evaluation     Bari Mantis Audiologist, Au.D., CCC-A 11/11/2020  1:24 PM  Cc: Vernie Shanks, MD

## 2020-11-16 ENCOUNTER — Ambulatory Visit: Payer: Medicare Other | Admitting: Audiologist

## 2020-11-19 ENCOUNTER — Ambulatory Visit: Payer: Medicare Other | Admitting: Audiology

## 2020-12-01 DIAGNOSIS — K2970 Gastritis, unspecified, without bleeding: Secondary | ICD-10-CM | POA: Diagnosis not present

## 2020-12-01 DIAGNOSIS — Z8619 Personal history of other infectious and parasitic diseases: Secondary | ICD-10-CM | POA: Diagnosis not present

## 2020-12-01 DIAGNOSIS — R1013 Epigastric pain: Secondary | ICD-10-CM | POA: Diagnosis not present

## 2020-12-01 DIAGNOSIS — Z8719 Personal history of other diseases of the digestive system: Secondary | ICD-10-CM | POA: Diagnosis not present

## 2020-12-06 DIAGNOSIS — E039 Hypothyroidism, unspecified: Secondary | ICD-10-CM | POA: Diagnosis not present

## 2021-01-03 DIAGNOSIS — L989 Disorder of the skin and subcutaneous tissue, unspecified: Secondary | ICD-10-CM | POA: Diagnosis not present

## 2021-01-03 DIAGNOSIS — D485 Neoplasm of uncertain behavior of skin: Secondary | ICD-10-CM | POA: Diagnosis not present

## 2021-01-03 DIAGNOSIS — L218 Other seborrheic dermatitis: Secondary | ICD-10-CM | POA: Diagnosis not present

## 2021-01-13 DIAGNOSIS — L668 Other cicatricial alopecia: Secondary | ICD-10-CM | POA: Diagnosis not present

## 2021-02-03 DIAGNOSIS — Z8711 Personal history of peptic ulcer disease: Secondary | ICD-10-CM | POA: Diagnosis not present

## 2021-02-03 DIAGNOSIS — R1013 Epigastric pain: Secondary | ICD-10-CM | POA: Diagnosis not present

## 2021-02-03 DIAGNOSIS — K2930 Chronic superficial gastritis without bleeding: Secondary | ICD-10-CM | POA: Diagnosis not present

## 2021-02-03 DIAGNOSIS — K2940 Chronic atrophic gastritis without bleeding: Secondary | ICD-10-CM | POA: Diagnosis not present

## 2021-02-03 DIAGNOSIS — K31A19 Gastric intestinal metaplasia without dysplasia, unspecified site: Secondary | ICD-10-CM | POA: Diagnosis not present

## 2021-02-10 DIAGNOSIS — K31A19 Gastric intestinal metaplasia without dysplasia, unspecified site: Secondary | ICD-10-CM | POA: Diagnosis not present

## 2021-02-10 DIAGNOSIS — K2940 Chronic atrophic gastritis without bleeding: Secondary | ICD-10-CM | POA: Diagnosis not present

## 2021-03-17 DIAGNOSIS — D485 Neoplasm of uncertain behavior of skin: Secondary | ICD-10-CM | POA: Diagnosis not present

## 2021-03-17 DIAGNOSIS — L820 Inflamed seborrheic keratosis: Secondary | ICD-10-CM | POA: Diagnosis not present

## 2021-04-05 ENCOUNTER — Telehealth: Payer: Self-pay

## 2021-04-05 NOTE — Telephone Encounter (Signed)
Patient requesting to become a new patient her. However patient wanted to be seen before October.

## 2021-04-13 DIAGNOSIS — R21 Rash and other nonspecific skin eruption: Secondary | ICD-10-CM | POA: Diagnosis not present

## 2021-04-13 DIAGNOSIS — I10 Essential (primary) hypertension: Secondary | ICD-10-CM | POA: Diagnosis not present

## 2021-04-13 DIAGNOSIS — L509 Urticaria, unspecified: Secondary | ICD-10-CM | POA: Diagnosis not present

## 2021-04-13 DIAGNOSIS — E039 Hypothyroidism, unspecified: Secondary | ICD-10-CM | POA: Diagnosis not present

## 2021-04-18 ENCOUNTER — Ambulatory Visit: Payer: PPO | Admitting: Cardiology

## 2021-04-19 ENCOUNTER — Ambulatory Visit: Payer: PPO | Admitting: Cardiology

## 2021-04-19 DIAGNOSIS — H52203 Unspecified astigmatism, bilateral: Secondary | ICD-10-CM | POA: Diagnosis not present

## 2021-04-21 ENCOUNTER — Other Ambulatory Visit: Payer: Self-pay

## 2021-04-21 ENCOUNTER — Ambulatory Visit: Payer: Medicare Other | Admitting: Cardiology

## 2021-04-21 ENCOUNTER — Encounter: Payer: Self-pay | Admitting: Cardiology

## 2021-04-21 VITALS — BP 162/76 | HR 69 | Resp 16 | Ht 62.0 in | Wt 172.0 lb

## 2021-04-21 DIAGNOSIS — I10 Essential (primary) hypertension: Secondary | ICD-10-CM | POA: Diagnosis not present

## 2021-04-21 DIAGNOSIS — R011 Cardiac murmur, unspecified: Secondary | ICD-10-CM | POA: Diagnosis not present

## 2021-04-21 DIAGNOSIS — E7800 Pure hypercholesterolemia, unspecified: Secondary | ICD-10-CM | POA: Diagnosis not present

## 2021-04-21 DIAGNOSIS — R0989 Other specified symptoms and signs involving the circulatory and respiratory systems: Secondary | ICD-10-CM

## 2021-04-21 MED ORDER — HYDROCHLOROTHIAZIDE 12.5 MG PO CAPS: 12.5000 mg | ORAL_CAPSULE | ORAL | 3 refills | Status: DC

## 2021-04-21 MED ORDER — OLMESARTAN MEDOXOMIL 20 MG PO TABS: 20.0000 mg | ORAL_TABLET | Freq: Every evening | ORAL | Status: AC

## 2021-04-21 NOTE — Progress Notes (Signed)
Primary Physician/Referring:  Pcp, No  Patient ID: Meredith Escobar, female    DOB: Jan 10, 1941, 80 y.o.   MRN: [id number]  Chief Complaint  Patient presents with   Heart Murmur   Hypertension    HPI:    Meredith Escobar  is a 80 y.o. Caucasian female with past medical history of hypothyroidism, hypercholesterolemia, prediabetes, hypertension and hyperlipidemia who is reluctant to taking medications. Nuclear stress test in July 2019 which was nonischemic and echocardiogram revealed normal LV systolic function with mild diastolic dysfunction and carotid duplex scan revealed mild carotid disease.   Made an appointment to see me after recent examination she was told to have murmur.  Also she has noticed her blood pressure to be elevated.  She continues to remain active, and essentially asymptomatic.   Past Medical History:  Diagnosis Date   GERD (gastroesophageal reflux disease)    Hypothyroidism    Thyroid disease    Urticaria    Past Surgical History:  Procedure Laterality Date   APPENDECTOMY  1955   HAND SURGERY  12/14/2014   Hand palmar abscess deep irrigation and drainage   KNEE ARTHROSCOPY     Right Knee-1992 and 2007; Left Knee- 2001   KNEE SURGERY Right 11/16/2009   LAPAROSCOPIC CHOLECYSTECTOMY  02/2004   OTHER SURGICAL HISTORY  1988   Hysterectomy   PATELLECTOMY  11/16/2009   Partial Patellectomy   SURGERY OF LIP  2003   TUBAL LIGATION  1980   Social History   Socioeconomic History   Marital status: Divorced    Spouse name: Not on file   Number of children: Not on file   Years of education: Not on file   Highest education level: Not on file  Occupational History   Not on file  Tobacco Use   Smoking status: Never   Smokeless tobacco: Never  Vaping Use   Vaping Use: Never used  Substance and Sexual Activity   Alcohol use: No   Drug use: No   Sexual activity: Not Currently  Other Topics Concern   Not on file  Social History Narrative   Not on file   Social  Determinants of Health   Financial Resource Strain: Not on file  Food Insecurity: Not on file  Transportation Needs: Not on file  Physical Activity: Not on file  Stress: Not on file  Social Connections: Not on file  Intimate Partner Violence: Not on file   ROS  Review of Systems  Cardiovascular:  Negative for chest pain, dyspnea on exertion and leg swelling.  Gastrointestinal:  Negative for melena.  Objective  Blood pressure (!) 162/76, pulse 69, resp. rate 16, height 5\' 2"  (1.575 m), weight 172 lb (78 kg), SpO2 98 %. Body mass index is 31.46 kg/m.   Physical Exam Neck:     Vascular: No carotid bruit or JVD.  Cardiovascular:     Rate and Rhythm: Normal rate and regular rhythm.     Pulses: Intact distal pulses.     Heart sounds: Normal heart sounds. No murmur heard.   No gallop.  Pulmonary:     Effort: Pulmonary effort is normal.     Breath sounds: Normal breath sounds.  Abdominal:     General: Bowel sounds are normal.     Palpations: Abdomen is soft.  Musculoskeletal:        General: No swelling.   Radiology: No results found.  Laboratory examination:   External labs:  12/06/2020: TSH 3.60, free T4 1.0, normal.  Cholesterol, total 236.000 m 06/22/2020 HDL 49.000 mg 06/22/2020 LDL 166.000 m 06/22/2020 Triglycerides 123.000 m 06/22/2020  A1C 6.100 % 06/22/2020  Hemoglobin 13.100 g/d 11/01/2020  Creatinine, Serum 1.000 mg/ 11/01/2020 eGFR 49/60 Potassium 4.300 mm 11/01/2020 Magnesium 2.300 mg/ 06/22/2020 ALT (SGPT) 10.000 U/L 11/01/2020  TSH 1.680 06/22/2020  01/29/2018: Hemoglobin A1c 5.8%.  CBC normal.  Creatinine 1.0, EGFR 49/60, potassium 4.8, CMP normal.  Vitamin D 35.4.  Thyroid panel normal.  Cholesterol 228, triglycerides 105, HDL 55, LDL 152.  CMP     Component Value Date/Time   NA 137 12/05/2015 0642   K 4.1 12/05/2015 0642   CL 100 (L) 12/05/2015 0642   GLUCOSE 120 (H) 12/05/2015 0642   BUN 15 12/05/2015 0642   CREATININE 0.90 12/05/2015 0642    Medications   Current Outpatient Medications  Medication Instructions   Cyanocobalamin (B-12) 1000 MCG CAPS 1 capsule, Oral, Daily   D3-1000 1,000 Units, Oral, Daily   Flaxseed, Linseed, (FLAXSEED OIL PO) 1,000 mg, Oral, Daily   fluticasone (FLONASE) 50 MCG/ACT nasal spray 2 sprays, Each Nare, Daily   hydrochlorothiazide (MICROZIDE) 12.5 mg, Oral, BH-each morning   levocetirizine (XYZAL) 5 MG tablet 1 tablet, Oral, Daily at bedtime   levothyroxine (SYNTHROID) 50 mcg, Oral, Daily before breakfast   NON FORMULARY Keranew shampoo   NON FORMULARY 1 capsule, Oral, Daily, Apple pectin   olmesartan (BENICAR) 20 mg, Oral, Every evening   QUERCETIN PO 500 mg, Oral, Daily   Selenium 200 MCG CAPS 1 capsule, Oral, Daily   vitamin C 100 mg, Oral, Daily   Cardiac Studies:   Echocardiogram 04/24/2018: Left ventricle cavity is normal in size. Mild concentric hypertrophy of the left ventricle. Normal global wall motion. Doppler evidence of grade I (impaired) diastolic dysfunction, normal LAP. Calculated EF 62%. Right atrial cavity is slightly dilated. Moderate tricuspid regurgitation. Estimated pulmonary artery systolic pressure 40 mmHg.  Lexiscan myoview stress test 03/18/2018: 1. The resting electrocardiogram demonstrated normal sinus rhythm, Poor R wave progression. normal resting conduction and no resting arrhythmias. The stress electrocardiogram was non diagnostic for ischemia due to pharmacologic stress. Stress symptoms included dizziness. The stress test was terminated because of end of protocol. 2. Myocardial perfusion imaging is normal. Overall left ventricular systolic function was normal without regional wall motion abnormalities. The left ventricular ejection fraction was 81%. This is a low risk study.  Carotid artery duplex  04/21/2019: Minimal plaque bilateral carotid arteries without hemodynamically significant stenosis.  Antegrade right vertebral artery flow. Antegrade left  vertebral artery flow. Compared to the study done on 04/24/2018, bilateral less than 50% stenosis in the ICA is no longer present. Follow up is appropriate if clinically indicated.  EKG:  EKG 04/21/2021: Normal sinus rhythm at rate of 59 bpm, left atrial enlargement, nonspecific sagging ST segment depression in the inferior and lateral leads unchanged from 04/28/2019.  No change from 02/07/2018.  Assessment     ICD-10-CM   1. Essential hypertension  I10 EKG 12-Lead    PCV ECHOCARDIOGRAM COMPLETE    olmesartan (BENICAR) 20 MG tablet    hydrochlorothiazide (MICROZIDE) 12.5 MG capsule    CMP14+EGFR    CMP14+EGFR    2. Pure hypercholesterolemia  E78.00     3. Bilateral carotid bruits  R09.89 PCV CAROTID DUPLEX (BILATERAL)    4. Murmur  R01.1 PCV ECHOCARDIOGRAM COMPLETE      Recommendations:   Meredith Escobar is a 80 y.o. Caucasian female with past medical history of hypothyroidism, hypercholesterolemia, prediabetes, hypertension and  hyperlipidemia who is reluctant to taking medications. Nuclear stress test in July 2019 which was nonischemic and echocardiogram revealed normal LV systolic function with mild diastolic dysfunction and carotid duplex scan revealed mild carotid disease.   Made an appointment to see me after recent examination she was told to have murmur.  Also she has noticed her blood pressure to be elevated.  Discussed with the patient regarding increasing the dose of olmesartan versus adding a diuretic to reduce systolic blood pressure.  She is very cautious with the medication but she is agreeable to this.  Will obtain a CMP in 2 weeks to 3 weeks after starting the medication.  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  Carotid artery duplex had revealed mild disease  previously however her carotid bruit are fairly prominent.  We will recheck carotid duplex.  She does have hyperlipidemia but does not want to be on statins.  Clearly if carotid duplex revealed significant disease, I will try to convince her for going on statin therapy.  I would like to see her back in 6 weeks for follow-up.   Adrian Prows, MD, [facility] 04/21/2021, 11:12 PM Office: [phone] Fax: [phone] Pager: [phone]

## 2021-05-06 ENCOUNTER — Ambulatory Visit: Payer: Medicare Other

## 2021-05-06 ENCOUNTER — Other Ambulatory Visit: Payer: Self-pay

## 2021-05-06 DIAGNOSIS — R011 Cardiac murmur, unspecified: Secondary | ICD-10-CM

## 2021-05-06 DIAGNOSIS — I10 Essential (primary) hypertension: Secondary | ICD-10-CM | POA: Diagnosis not present

## 2021-05-06 DIAGNOSIS — R0989 Other specified symptoms and signs involving the circulatory and respiratory systems: Secondary | ICD-10-CM | POA: Diagnosis not present

## 2021-05-07 LAB — CMP14+EGFR
ALT: 18 IU/L (ref 0–32)
AST: 23 IU/L (ref 0–40)
Albumin/Globulin Ratio: 1.7 (ref 1.2–2.2)
Albumin: 4.6 g/dL (ref 3.7–4.7)
Alkaline Phosphatase: 103 IU/L (ref 44–121)
BUN/Creatinine Ratio: 22 (ref 12–28)
BUN: 22 mg/dL (ref 8–27)
Bilirubin Total: 0.4 mg/dL (ref 0.0–1.2)
CO2: 23 mmol/L (ref 20–29)
Calcium: 10.4 mg/dL — ABNORMAL HIGH (ref 8.7–10.3)
Chloride: 100 mmol/L (ref 96–106)
Creatinine, Ser: 1.01 mg/dL — ABNORMAL HIGH (ref 0.57–1.00)
Globulin, Total: 2.7 g/dL (ref 1.5–4.5)
Glucose: 100 mg/dL — ABNORMAL HIGH (ref 65–99)
Potassium: 4.2 mmol/L (ref 3.5–5.2)
Sodium: 139 mmol/L (ref 134–144)
Total Protein: 7.3 g/dL (ref 6.0–8.5)
eGFR: 57 mL/min/{1.73_m2} — ABNORMAL LOW (ref >59)

## 2021-05-07 NOTE — Progress Notes (Signed)
Stable kidney function. No change in values from previous  Creatinine, Serum 1.000 mg/ 11/01/2020 eGFR 49/60

## 2021-06-07 ENCOUNTER — Other Ambulatory Visit: Payer: Self-pay

## 2021-06-07 ENCOUNTER — Ambulatory Visit: Payer: Medicare Other | Admitting: Cardiology

## 2021-06-07 ENCOUNTER — Encounter: Payer: Self-pay | Admitting: Cardiology

## 2021-06-07 VITALS — BP 130/74 | HR 65 | Temp 98.3°F | Resp 16 | Ht 62.0 in | Wt 170.8 lb

## 2021-06-07 DIAGNOSIS — R0989 Other specified symptoms and signs involving the circulatory and respiratory systems: Secondary | ICD-10-CM

## 2021-06-07 DIAGNOSIS — I10 Essential (primary) hypertension: Secondary | ICD-10-CM

## 2021-06-07 DIAGNOSIS — E7800 Pure hypercholesterolemia, unspecified: Secondary | ICD-10-CM | POA: Diagnosis not present

## 2021-06-07 DIAGNOSIS — N1831 Chronic kidney disease, stage 3a: Secondary | ICD-10-CM

## 2021-06-07 MED ORDER — SIMVASTATIN 10 MG PO TABS: 10.0000 mg | ORAL_TABLET | Freq: Every day | ORAL | 2 refills | Status: DC

## 2021-06-07 NOTE — Progress Notes (Signed)
Primary Physician/Referring:  Orvan July, NP  Patient ID: Meredith Escobar, female    DOB: Apr 10, 1941, 80 y.o.   MRN: [id number]  Chief Complaint  Patient presents with   Hypertension   Results    Echocardiogram  and Carotid duplex   Hyperlipidemia   HPI:    Meredith Escobar  is a 80 y.o. Caucasian female with past medical history of hypothyroidism, hypercholesterolemia, prediabetes, hypertension and hyperlipidemia who is reluctant to taking medications. Nuclear stress test in July 2019 which was nonischemic and echocardiogram revealed normal LV systolic function with mild diastolic dysfunction and carotid duplex scan revealed mild carotid disease.   Made an appointment to see me after recent examination she was told to have murmur.  I seen her 6 weeks ago, I had added HCTZ 12.5 mg daily for hypertension and underwent carotid artery duplex and echocardiogram and presents for follow-up.  She remains asymptomatic and active.  Past Medical History:  Diagnosis Date   GERD (gastroesophageal reflux disease)    Hypothyroidism    Thyroid disease    Urticaria    Past Surgical History:  Procedure Laterality Date   APPENDECTOMY  1955   HAND SURGERY  12/14/2014   Hand palmar abscess deep irrigation and drainage   KNEE ARTHROSCOPY     Right Knee-1992 and 2007; Left Knee- 2001   KNEE SURGERY Right 11/16/2009   LAPAROSCOPIC CHOLECYSTECTOMY  02/2004   OTHER SURGICAL HISTORY  1988   Hysterectomy   PATELLECTOMY  11/16/2009   Partial Patellectomy   SURGERY OF LIP  2003   TUBAL LIGATION  1980   Social History   Tobacco Use   Smoking status: Never   Smokeless tobacco: Never  Substance Use Topics   Alcohol use: No  Marital Status: Divorced    ROS  Review of Systems  Cardiovascular:  Negative for chest pain, dyspnea on exertion and leg swelling.  Gastrointestinal:  Negative for melena.  Objective  Blood pressure 130/74, pulse 65, temperature 98.3 F (36.8 C), temperature source Temporal,  resp. rate 16, height $RemoveBe'5\' 2"'GYwxjGMhd$  (1.575 m), weight 170 lb 12.8 oz (77.5 kg), SpO2 98 %. Body mass index is 31.24 kg/m. Vitals with BMI 06/07/2021 04/21/2021 02/10/2020  Height $Remov'5\' 2"'MQIqXw$  $Remove'5\' 2"'pUuAMCP$  $RemoveB'5\' 2"'zjXXCMiu$   Weight 170 lbs 13 oz 172 lbs 169 lbs 6 oz  BMI 31.23 16.96 78.93  Systolic 810 175 102  Diastolic 74 76 60  Pulse 65 69 67      Physical Exam Neck:     Vascular: No carotid bruit or JVD.  Cardiovascular:     Rate and Rhythm: Normal rate and regular rhythm.     Pulses: Intact distal pulses.     Heart sounds: Normal heart sounds. No murmur heard.   No gallop.  Pulmonary:     Effort: Pulmonary effort is normal.     Breath sounds: Normal breath sounds.  Abdominal:     General: Bowel sounds are normal.     Palpations: Abdomen is soft.  Musculoskeletal:        General: No swelling.   Radiology: No results found.  Laboratory examination:   CMP     Component Value Date/Time   NA 139 05/06/2021 0808   K 4.2 05/06/2021 0808   CL 100 05/06/2021 0808   CO2 23 05/06/2021 0808   GLUCOSE 100 (H) 05/06/2021 0808   GLUCOSE 120 (H) 12/05/2015 0642   BUN 22 05/06/2021 0808   CREATININE 1.01 (H) 05/06/2021 5852  CALCIUM 10.4 (H) 05/06/2021 0808   PROT 7.3 05/06/2021 0808   ALBUMIN 4.6 05/06/2021 0808   AST 23 05/06/2021 0808   ALT 18 05/06/2021 0808   ALKPHOS 103 05/06/2021 0808   BILITOT 0.4 05/06/2021 0808   External labs:  12/06/2020: TSH 3.60, free T4 1.0, normal.  Cholesterol, total 236.000 m 06/22/2020 HDL 49.000 mg 06/22/2020 LDL 166.000 m 06/22/2020 Triglycerides 123.000 m 06/22/2020  A1C 6.100 % 06/22/2020  Hemoglobin 13.100 g/d 11/01/2020  Creatinine, Serum 1.000 mg/ 11/01/2020 eGFR 49/60 Potassium 4.300 mm 11/01/2020 Magnesium 2.300 mg/ 06/22/2020 ALT (SGPT) 10.000 U/L 11/01/2020  TSH 1.680 06/22/2020  01/29/2018: Hemoglobin A1c 5.8%.  CBC normal.  Creatinine 1.0, EGFR 49/60, potassium 4.8, CMP normal.  Vitamin D 35.4.  Thyroid panel normal.  Cholesterol 228, triglycerides  105, HDL 55, LDL 152.  CMP     Component Value Date/Time   NA 139 05/06/2021 0808   K 4.2 05/06/2021 0808   CL 100 05/06/2021 0808   CO2 23 05/06/2021 0808   GLUCOSE 100 (H) 05/06/2021 0808   GLUCOSE 120 (H) 12/05/2015 0642   BUN 22 05/06/2021 0808   CREATININE 1.01 (H) 05/06/2021 0808   CALCIUM 10.4 (H) 05/06/2021 0808   PROT 7.3 05/06/2021 0808   ALBUMIN 4.6 05/06/2021 0808   AST 23 05/06/2021 0808   ALT 18 05/06/2021 0808   ALKPHOS 103 05/06/2021 0808   BILITOT 0.4 05/06/2021 0808   Medications   Current Outpatient Medications  Medication Instructions   Cyanocobalamin (B-12) 1000 MCG CAPS 1 capsule, Oral, Daily   D3-1000 1,000 Units, Oral, Daily   Flaxseed, Linseed, (FLAXSEED OIL PO) 1,000 mg, Oral, Daily   fluticasone (FLONASE) 50 MCG/ACT nasal spray 2 sprays, Each Nare, Daily   hydrochlorothiazide (MICROZIDE) 12.5 mg, Oral, BH-each morning   levocetirizine (XYZAL) 5 MG tablet 1 tablet, Oral, Daily at bedtime   levothyroxine (SYNTHROID) 50 mcg, Oral, Daily before breakfast   NON FORMULARY Keranew shampoo   NON FORMULARY 1 capsule, Oral, Daily, Apple pectin   olmesartan (BENICAR) 20 mg, Oral, Every evening   QUERCETIN PO 500 mg, Oral, Daily   Selenium 200 MCG CAPS 1 capsule, Oral, Daily   simvastatin (ZOCOR) 10 mg, Oral, Daily at bedtime   vitamin C 100 mg, Oral, Daily    Lexiscan myoview stress test 03/18/2018: 1. The resting electrocardiogram demonstrated normal sinus rhythm, Poor R wave progression. normal resting conduction and no resting arrhythmias. The stress electrocardiogram was non diagnostic for ischemia due to pharmacologic stress. Stress symptoms included dizziness. The stress test was terminated because of end of protocol. 2. Myocardial perfusion imaging is normal. Overall left ventricular systolic function was normal without regional wall motion abnormalities. The left ventricular ejection fraction was 81%. This is a low risk study.  Carotid artery  duplex 05/06/2021: Duplex suggests stenosis in the right internal carotid artery (1-15%). Duplex suggests stenosis in the right external carotid artery (<50%). Duplex suggests stenosis in the left internal carotid artery (1-15%). Duplex suggests stenosis in the left external carotid artery (<50%). Very mild plaque noted in bilateral carotid arteries. 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  Antegrade right vertebral artery flow. Antegrade left vertebral artery flow. No significant change from 04/21/2019. Follow up is appropriate if clinically indicated.  PCV ECHOCARDIOGRAM COMPLETE 05/06/2021  Narrative Echocardiogram 05/06/2021: Normal LV systolic function with visual EF 60-65%. Left ventricle cavity is normal in size. Normal left ventricular wall thickness. Normal global wall motion. Doppler evidence of grade II (pseudonormal) diastolic  dysfunction, elevated LAP. Moderate tricuspid regurgitation. Mild pulmonary hypertension. RVSP measures 39 mmHg. Compared to study 04/24/2018 G1 DD progressed to G2 with elevated LAP and TR/PHTN remains stable.     EKG:  EKG 04/21/2021: Normal sinus rhythm at rate of 59 bpm, left atrial enlargement, nonspecific sagging ST segment depression in the inferior and lateral leads unchanged from 04/28/2019.  No change from 02/07/2018.  Assessment     ICD-10-CM   1. Essential hypertension  I10     2. Bilateral carotid bruits  R09.89 simvastatin (ZOCOR) 10 MG tablet    3. Stage 3a chronic kidney disease (HCC)  N18.31     4. Pure hypercholesterolemia  E78.00 simvastatin (ZOCOR) 10 MG tablet      Recommendations:   Meredith Escobar is a 80 y.o. Caucasian female with past medical history of hypothyroidism, hypercholesterolemia, prediabetes, hypertension and hyperlipidemia who is reluctant to taking medications. Nuclear stress test in July 2019 which was nonischemic and echocardiogram revealed normal LV systolic function with mild diastolic  dysfunction and carotid duplex scan revealed mild carotid disease.   Made an appointment to see me after recent examination she was told to have murmur.  I reviewed the results of the echocardiogram, she has mild to moderate tricuspid regurgitation probably related to diastolic dysfunction and hypertension and age as risk factor for diastolic dysfunction.  She is now tolerating HCTZ and blood pressure is under excellent control and she is tolerating this well without any side effects.  Hence advised her to continue the same for now.   She does have hyperlipidemia but does not want to be on statins.  I reviewed the results of the carotid artery duplex, she does have mild to moderate amount of diffuse atherosclerosis involving the carotid arteries but without any high-grade stenosis.  Carotid bruits from external carotid artery stenosis only.  After discussions, she is willing to try simvastatin, 10 mg daily, this dose will not bring LDL to goal however at least it may offer her some benefit of reduction in MI and also stroke.  Renal function is mildly reduced, suspect this is age-related and also from hypertension.  Otherwise stable from cardiac standpoint, no further evaluation is indicated.  I will see her back on a as needed basis.    Adrian Prows, MD, [facility] 06/07/2021, 10:36 AM Office: [phone] Fax: [phone] Pager: [phone]

## 2021-06-17 DIAGNOSIS — R7303 Prediabetes: Secondary | ICD-10-CM | POA: Diagnosis not present

## 2021-06-17 DIAGNOSIS — E7800 Pure hypercholesterolemia, unspecified: Secondary | ICD-10-CM | POA: Diagnosis not present

## 2021-06-17 DIAGNOSIS — E039 Hypothyroidism, unspecified: Secondary | ICD-10-CM | POA: Diagnosis not present

## 2021-11-13 DIAGNOSIS — R140 Abdominal distension (gaseous): Secondary | ICD-10-CM | POA: Diagnosis not present

## 2021-11-14 DIAGNOSIS — R140 Abdominal distension (gaseous): Secondary | ICD-10-CM | POA: Diagnosis not present

## 2021-12-09 DIAGNOSIS — Z124 Encounter for screening for malignant neoplasm of cervix: Secondary | ICD-10-CM | POA: Diagnosis not present

## 2021-12-09 DIAGNOSIS — Z1231 Encounter for screening mammogram for malignant neoplasm of breast: Secondary | ICD-10-CM | POA: Diagnosis not present

## 2021-12-09 DIAGNOSIS — Z01419 Encounter for gynecological examination (general) (routine) without abnormal findings: Secondary | ICD-10-CM | POA: Diagnosis not present

## 2021-12-09 DIAGNOSIS — Z01411 Encounter for gynecological examination (general) (routine) with abnormal findings: Secondary | ICD-10-CM | POA: Diagnosis not present

## 2021-12-13 ENCOUNTER — Other Ambulatory Visit: Payer: Self-pay | Admitting: Obstetrics

## 2021-12-13 DIAGNOSIS — M8580 Other specified disorders of bone density and structure, unspecified site: Secondary | ICD-10-CM

## 2022-01-06 DIAGNOSIS — R062 Wheezing: Secondary | ICD-10-CM | POA: Diagnosis not present

## 2022-01-06 DIAGNOSIS — U071 COVID-19: Secondary | ICD-10-CM | POA: Diagnosis not present

## 2022-01-06 DIAGNOSIS — R0981 Nasal congestion: Secondary | ICD-10-CM | POA: Diagnosis not present

## 2022-01-06 DIAGNOSIS — R059 Cough, unspecified: Secondary | ICD-10-CM | POA: Diagnosis not present

## 2022-01-30 DIAGNOSIS — Z1389 Encounter for screening for other disorder: Secondary | ICD-10-CM | POA: Diagnosis not present

## 2022-01-30 DIAGNOSIS — Z0000 Encounter for general adult medical examination without abnormal findings: Secondary | ICD-10-CM | POA: Diagnosis not present

## 2022-01-31 ENCOUNTER — Ambulatory Visit: Payer: Medicare Other | Admitting: Cardiology

## 2022-01-31 ENCOUNTER — Encounter: Payer: Self-pay | Admitting: Cardiology

## 2022-01-31 VITALS — BP 167/71 | HR 62 | Temp 97.2°F | Resp 98 | Ht 62.0 in | Wt 175.0 lb

## 2022-01-31 DIAGNOSIS — E7800 Pure hypercholesterolemia, unspecified: Secondary | ICD-10-CM

## 2022-01-31 DIAGNOSIS — N1831 Chronic kidney disease, stage 3a: Secondary | ICD-10-CM | POA: Diagnosis not present

## 2022-01-31 DIAGNOSIS — I129 Hypertensive chronic kidney disease with stage 1 through stage 4 chronic kidney disease, or unspecified chronic kidney disease: Secondary | ICD-10-CM | POA: Diagnosis not present

## 2022-01-31 DIAGNOSIS — I10 Essential (primary) hypertension: Secondary | ICD-10-CM

## 2022-01-31 NOTE — Progress Notes (Signed)
Primary Physician/Referring:  Orvan July, NP  Patient ID: Meredith Escobar, female    DOB: 1941/06/28, 81 y.o.   MRN: [id number]  Chief Complaint  Patient presents with   Hypertension   Follow-up   HPI:    Meredith Escobar  is a 81 y.o.  Caucasian female with past medical history of hypothyroidism, hypercholesterolemia, prediabetes, hypertension and hyperlipidemia who is reluctant to taking medications. Nuclear stress test in July 2019 which was nonischemic and echocardiogram revealed normal LV systolic function with mild diastolic dysfunction and carotid duplex scan revealed mild carotid disease.    Appointment to see me as she had COVID infection in April 2023, patient is not vaccinated.  She is presently 81 years of age.  She remains asymptomatic and active.  Past Medical History:  Diagnosis Date   GERD (gastroesophageal reflux disease)    Hypothyroidism    Thyroid disease    Urticaria    Past Surgical History:  Procedure Laterality Date   APPENDECTOMY  1955   HAND SURGERY  12/14/2014   Hand palmar abscess deep irrigation and drainage   KNEE ARTHROSCOPY     Right Knee-1992 and 2007; Left Knee- 2001   KNEE SURGERY Right 11/16/2009   LAPAROSCOPIC CHOLECYSTECTOMY  02/2004   OTHER SURGICAL HISTORY  1988   Hysterectomy   PATELLECTOMY  11/16/2009   Partial Patellectomy   SURGERY OF LIP  2003   TUBAL LIGATION  1980   Social History   Tobacco Use   Smoking status: Never   Smokeless tobacco: Never  Substance Use Topics   Alcohol use: No  Marital Status: Divorced    ROS  Review of Systems  Cardiovascular:  Negative for chest pain, dyspnea on exertion and leg swelling.  Gastrointestinal:  Positive for heartburn. Negative for melena.  Objective  Blood pressure (!) 167/71, pulse 62, temperature (!) 97.2 F (36.2 C), temperature source Temporal, resp. rate (!) 98, height $RemoveBe'5\' 2"'nsnPDuuYA$  (1.575 m), weight 175 lb (79.4 kg), SpO2 96 %. Body mass index is 32.01 kg/m.    01/31/2022    3:13  PM 01/31/2022    3:00 PM 06/07/2021   10:10 AM  Vitals with BMI  Height  $Remov'5\' 2"'JREZgU$  $Remove'5\' 2"'eWuALEj$   Weight  175 lbs 170 lbs 13 oz  BMI  32 87.56  Systolic 433 295 188  Diastolic 71 84 74  Pulse 62 64 65     Physical Exam Neck:     Vascular: No carotid bruit or JVD.  Cardiovascular:     Rate and Rhythm: Normal rate and regular rhythm.     Pulses: Intact distal pulses.     Heart sounds: A midsystolic click. Murmur heard.  Blowing systolic murmur is present at the lower left sternal border.    No gallop.  Pulmonary:     Effort: Pulmonary effort is normal.     Breath sounds: Normal breath sounds.  Abdominal:     General: Bowel sounds are normal.     Palpations: Abdomen is soft.  Musculoskeletal:        General: No swelling.   Radiology: No results found.  Laboratory examination:    External labs:  Labs 06/17/2021:  A1c 5.9%.  TSH normal at 1.80.  BUN 24, creatinine 0.99, EGFR 58 mL, potassium 4.5, LFTs normal.  Total cholesterol 237, triglycerides 138, HDL 43, LDL 169.  Hb 13.1/HCT 39.7, platelets 256, normal indicis.   Medications   Current Outpatient Medications:    Ascorbic Acid (VITAMIN C)  100 MG tablet, Take 100 mg by mouth daily., Disp: , Rfl:    Cholecalciferol (D3-1000) 25 MCG (1000 UT) capsule, Take 1,000 Units by mouth daily., Disp: , Rfl:    Cyanocobalamin (B-12) 1000 MCG CAPS, Take 1 capsule by mouth daily., Disp: , Rfl:    Flaxseed, Linseed, (FLAXSEED OIL PO), Take 1,000 mg by mouth daily at 12 noon., Disp: , Rfl:    fluticasone (FLONASE) 50 MCG/ACT nasal spray, Place 2 sprays into both nostrils daily., Disp: , Rfl:    hydrochlorothiazide (MICROZIDE) 12.5 MG capsule, Take 1 capsule (12.5 mg total) by mouth every morning., Disp: 90 capsule, Rfl: 3   levocetirizine (XYZAL) 5 MG tablet, Take 1 tablet by mouth at bedtime., Disp: , Rfl:    levothyroxine (SYNTHROID) 50 MCG tablet, Take 50 mcg by mouth daily before breakfast., Disp: , Rfl:    NON FORMULARY, Keranew  shampoo, Disp: , Rfl:    NON FORMULARY, Take 1 capsule by mouth daily at 12 noon. Apple pectin, Disp: , Rfl:    olmesartan (BENICAR) 20 MG tablet, Take 1 tablet (20 mg total) by mouth every evening., Disp: , Rfl:    QUERCETIN PO, Take 500 mg by mouth daily at 12 noon., Disp: , Rfl:    simvastatin (ZOCOR) 10 MG tablet, Take 1 tablet (10 mg total) by mouth at bedtime., Disp: 30 tablet, Rfl: 2    Lexiscan myoview stress test 03/18/2018: 1. The resting electrocardiogram demonstrated normal sinus rhythm, Poor R wave progression. normal resting conduction and no resting arrhythmias. The stress electrocardiogram was non diagnostic for ischemia due to pharmacologic stress. Stress symptoms included dizziness. The stress test was terminated because of end of protocol. 2. Myocardial perfusion imaging is normal. Overall left ventricular systolic function was normal without regional wall motion abnormalities. The left ventricular ejection fraction was 81%. This is a low risk study.  Carotid artery duplex 05/06/2021: Duplex suggests stenosis in the right internal carotid artery (1-15%). Duplex suggests stenosis in the right external carotid artery (<50%). Duplex suggests stenosis in the left internal carotid artery (1-15%). Duplex suggests stenosis in the left external carotid artery (<50%). Very mild plaque noted in bilateral carotid arteries. 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  Antegrade right vertebral artery flow. Antegrade left vertebral artery flow. No significant change from 04/21/2019. Follow up is appropriate if clinically indicated.  PCV ECHOCARDIOGRAM COMPLETE 05/06/2021  Narrative Echocardiogram 05/06/2021: Normal LV systolic function with visual EF 60-65%. Left ventricle cavity is normal in size. Normal left ventricular wall thickness. Normal global wall motion. Doppler evidence of grade II (pseudonormal) diastolic dysfunction, elevated LAP. Moderate tricuspid  regurgitation. Mild pulmonary hypertension. RVSP measures 39 mmHg. Compared to study 04/24/2018 G1 DD progressed to G2 with elevated LAP and TR/PHTN remains stable.     EKG: EKG 01/31/2022: Normal sinus rhythm with rate of 63 bpm, LAE, normal axis, inferior lateral ST depression, may suggest ischemia versus LVH.  No significant change from 04/21/2021 and 04/28/2019.   Assessment     ICD-10-CM   1. Essential hypertension  I10 EKG 12-Lead    2. Stage 3a chronic kidney disease (HCC)  N18.31     3. Pure hypercholesterolemia  E78.00          Recommendations:   Meredith Escobar is a 81 y.o. Caucasian female with past medical history of hypothyroidism, hypercholesterolemia, prediabetes, hypertension and hyperlipidemia who is reluctant to taking medications. Nuclear stress test in July 2019 which was nonischemic and echocardiogram revealed normal  LV systolic function with mild diastolic dysfunction and carotid duplex scan revealed mild carotid disease.    Appointment to see me as she had COVID infection in April 2023, patient is not vaccinated.  Her physical examination is remarkably unchanged from prior examination a year ago although I do hear her tricuspid regurgitation murmur easily.  There is no JVD, no right-sided heart failure, no clinical evidence of left-sided heart failure either.  She remains asymptomatic and continues to remain active, presently 81 years of age.  Blood pressure was markedly elevated today.  She brings home recordings of blood pressure which is under excellent control, please see above media and closed.  She does have hyperlipidemia but does not want to be on statins.   Renal function is mildly reduced, suspect this is age-related and also from hypertension.  However renal function has remained stable since 2020.  Otherwise stable from cardiac standpoint, no further evaluation is indicated.  Review of records regurgitation, hypertension, abnormal EKG, I was going to see  her back in a year, however patient requests that I make her visits as needed visits.     Adrian Prows, MD, [facility] 01/31/2022, 3:30 PM Office: [phone] Fax: [phone] Pager: [phone]

## 2022-02-08 DIAGNOSIS — K2970 Gastritis, unspecified, without bleeding: Secondary | ICD-10-CM | POA: Diagnosis not present

## 2022-02-08 DIAGNOSIS — K219 Gastro-esophageal reflux disease without esophagitis: Secondary | ICD-10-CM | POA: Diagnosis not present

## 2022-02-28 DIAGNOSIS — L299 Pruritus, unspecified: Secondary | ICD-10-CM | POA: Diagnosis not present

## 2022-04-07 DIAGNOSIS — H6122 Impacted cerumen, left ear: Secondary | ICD-10-CM | POA: Diagnosis not present

## 2022-04-11 DIAGNOSIS — R7303 Prediabetes: Secondary | ICD-10-CM | POA: Diagnosis not present

## 2022-04-11 DIAGNOSIS — E039 Hypothyroidism, unspecified: Secondary | ICD-10-CM | POA: Diagnosis not present

## 2022-04-11 DIAGNOSIS — E7800 Pure hypercholesterolemia, unspecified: Secondary | ICD-10-CM | POA: Diagnosis not present

## 2022-04-11 DIAGNOSIS — I10 Essential (primary) hypertension: Secondary | ICD-10-CM | POA: Diagnosis not present

## 2022-04-11 DIAGNOSIS — M542 Cervicalgia: Secondary | ICD-10-CM | POA: Diagnosis not present

## 2022-04-20 DIAGNOSIS — H3500 Unspecified background retinopathy: Secondary | ICD-10-CM | POA: Diagnosis not present

## 2022-04-20 DIAGNOSIS — H43813 Vitreous degeneration, bilateral: Secondary | ICD-10-CM | POA: Diagnosis not present

## 2022-05-23 DIAGNOSIS — M79601 Pain in right arm: Secondary | ICD-10-CM | POA: Diagnosis not present

## 2022-05-23 DIAGNOSIS — I129 Hypertensive chronic kidney disease with stage 1 through stage 4 chronic kidney disease, or unspecified chronic kidney disease: Secondary | ICD-10-CM | POA: Diagnosis not present

## 2022-05-23 DIAGNOSIS — R202 Paresthesia of skin: Secondary | ICD-10-CM | POA: Diagnosis not present

## 2022-05-23 DIAGNOSIS — N1831 Chronic kidney disease, stage 3a: Secondary | ICD-10-CM | POA: Diagnosis not present

## 2022-05-25 DIAGNOSIS — I129 Hypertensive chronic kidney disease with stage 1 through stage 4 chronic kidney disease, or unspecified chronic kidney disease: Secondary | ICD-10-CM | POA: Diagnosis not present

## 2022-05-25 DIAGNOSIS — R42 Dizziness and giddiness: Secondary | ICD-10-CM | POA: Diagnosis not present

## 2022-05-25 DIAGNOSIS — H8110 Benign paroxysmal vertigo, unspecified ear: Secondary | ICD-10-CM | POA: Diagnosis not present

## 2022-05-25 DIAGNOSIS — N1831 Chronic kidney disease, stage 3a: Secondary | ICD-10-CM | POA: Diagnosis not present

## 2022-05-29 DIAGNOSIS — K219 Gastro-esophageal reflux disease without esophagitis: Secondary | ICD-10-CM | POA: Diagnosis not present

## 2022-05-29 NOTE — Therapy (Signed)
OUTPATIENT PHYSICAL THERAPY VESTIBULAR EVALUATION     Patient Name: Meredith Escobar MRN: [id number] DOB:09/19/1940, 81 y.o., female Today's Date: 05/30/2022  PCP: Orvan July, NP REFERRING PROVIDER: Lurline Del, DO   PT End of Session - 05/30/22 1100     Visit Number 1    Number of Visits 9    Date for PT Re-Evaluation 06/27/22    Authorization Type BCBS Medicare    PT Start Time 1019    PT Stop Time 1103    PT Time Calculation (min) 44 min    Activity Tolerance Patient tolerated treatment well    Behavior During Therapy WFL for tasks assessed/performed             Past Medical History:  Diagnosis Date   GERD (gastroesophageal reflux disease)    Hypothyroidism    Thyroid disease    Urticaria    Past Surgical History:  Procedure Laterality Date   APPENDECTOMY  1955   HAND SURGERY  12/14/2014   Hand palmar abscess deep irrigation and drainage   KNEE ARTHROSCOPY     Right Knee-1992 and 2007; Left Knee- 2001   KNEE SURGERY Right 11/16/2009   LAPAROSCOPIC CHOLECYSTECTOMY  02/2004   OTHER SURGICAL HISTORY  1988   Hysterectomy   PATELLECTOMY  11/16/2009   Partial Patellectomy   SURGERY OF LIP  2003   TUBAL LIGATION  1980   Patient Active Problem List   Diagnosis Date Noted   Bilateral impacted cerumen 07/07/2016   Chronic seasonal allergic rhinitis 07/07/2016   ETD (Eustachian tube dysfunction), bilateral 07/07/2016    ONSET DATE: May 2023  REFERRING DIAG: H81.10 (ICD-10-CM) - BPPV (benign paroxysmal positional vertigo)  THERAPY DIAG:  BPPV (benign paroxysmal positional vertigo), right  Dizziness and giddiness  Unsteadiness on feet  Rationale for Evaluation and Treatment Rehabilitation  SUBJECTIVE:   SUBJECTIVE STATEMENT: Patient reports dizziness started in May- occurred when she got up abruptly out of bed. Has been on and off since then. Went to the ENT "to rule the ear out" and everything was good there. Dizziness is described as spinning and  lasts seconds. Worse when rolling in bed but unsure which side. Had some problems with elevated BP early this month and notes some hx of neck pain. Denies head trauma, infection/illness, vision changes/double vision, hearing loss, tinnitus, otalgia.  Pt accompanied by: self  PERTINENT HISTORY: GERD, hypothyroidism, R knee scope, partial patellectomy 2011   PAIN:  Are you having pain? No  PRECAUTIONS: None  WEIGHT BEARING RESTRICTIONS No  FALLS: Has patient fallen in last 6 months? No  LIVING ENVIRONMENT: Lives with: lives alone Lives in: House/apartment independent living Stairs:  stairs and elevator to 3rd floor  Has following equipment at home: Walker - 2 wheeled  PLOF: Independent  PATIENT GOALS improve dizziness   OBJECTIVE:   DIAGNOSTIC FINDINGS: none recent  COGNITION: Overall cognitive status: Within functional limits for tasks assessed   SENSATION: Reports intermittent N/T in the R hand- patient shows R UE EMG results from 2016 which was normal  POSTURE: rounded shoulders   GAIT: Gait pattern:  WNL Assistive device utilized: None Level of assistance: Complete Independence   FUNCTIONAL TESTs:  NT  PATIENT SURVEYS:  FOTO [phone]   VESTIBULAR ASSESSMENT   GENERAL OBSERVATION: patient wears readers PRN    OCULOMOTOR EXAM:   Ocular Alignment: normal   Ocular ROM: No Limitations   Spontaneous Nystagmus: absent   Gaze-Induced Nystagmus: absent   Smooth Pursuits: intact and  feeling "not normal"   Saccades: hypometric/undershoots to L; other directions intact; c/o feeling "not normal"      VESTIBULAR - OCULAR REFLEX:    Slow VOR: Normal and Comment: c/o "clicking" in the neck    VOR Cancellation: Normal   Head-Impulse Test: HIT Right: covert positive HIT Left: covert positive    POSITIONAL TESTING:   Right Roll Test: Negative Left Roll Test: negative Right Dix-Hallpike: R upbeating torsional nystagmus; Duration:10 sec Left Dix-Hallpike:  negative      VESTIBULAR TREATMENT:  Canalith Repositioning:   Epley Right: Number of Reps: 1, Response to Treatment: symptoms improved, and Comment: tolerated well; some dizziness upon sitting up   PATIENT EDUCATION: Education details: prognosis, POC, edu on BPPV & labyrinth anatomy  Person educated: Patient Education method: Explanation and Demonstration Education comprehension: verbalized understanding   GOALS: Goals reviewed with patient? Yes  SHORT TERM GOALS: Target date: 06/13/2022  Patient to be independent with initial HEP. Baseline: HEP initiated Goal status: INITIAL    LONG TERM GOALS: Target date: 06/27/2022  Patient to be independent with advanced HEP. Baseline: Not yet initiated  Goal status: INITIAL  Patient will report 0/10 dizziness with bed mobility.  Baseline: Symptomatic  Goal status: INITIAL  Patient to demonstrate mild-moderate sway with M-CTSIB condition with eyes closed/foam surface in order to improve safety in environments with uneven surfaces and dim lighting. Baseline: NT Goal status: INITIAL  Patient to score at least 20/24 on DGI in order to decrease risk of falls. Baseline: NT Goal status: INITIAL  Patient to score at least 70 on FOTO in order to indicate improved functional outcomes.  Baseline: 67 Goal status: INITIAL   ASSESSMENT:  CLINICAL IMPRESSION:  Patient is a 81 y/o F presenting to OPPT with c/o fluctuating dizziness since May 2023. Dizziness is described as spinning and lasts seconds. Worse when rolling in bed. Denies head trauma, infection/illness, vision changes/double vision, hearing loss, tinnitus, otalgia. Denies head trauma, infection/illness, vision changes/double vision, hearing loss, tinnitus, otalgia, photo/phonophobia. Oculomotor exam revealed slight undershooting with L saccades and covert positive B HIT. Positional testing was positive with R DH. Would benefit from skilled PT services 1-2x/week for 4 weeks to  address aforementioned impairments in order to optimize level of function.     OBJECTIVE IMPAIRMENTS decreased balance and dizziness.   ACTIVITY LIMITATIONS carrying, lifting, bending, transfers, bed mobility, dressing, and reach over head  PARTICIPATION LIMITATIONS: meal prep, cleaning, laundry, driving, shopping, and community activity  PERSONAL FACTORS Age, Past/current experiences, Sex, Time since onset of injury/illness/exacerbation, and 3+ comorbidities: GERD, hypothyroidism, R knee scope, partial patellectomy 2011  are also affecting patient's functional outcome.   REHAB POTENTIAL: Good  CLINICAL DECISION MAKING: Evolving/moderate complexity  EVALUATION COMPLEXITY: Moderate   PLAN: PT FREQUENCY: 1-2x/week  PT DURATION: 4 weeks  PLANNED INTERVENTIONS: Therapeutic exercises, Therapeutic activity, Neuromuscular re-education, Balance training, Gait training, Patient/Family education, Self Care, Joint mobilization, Stair training, Vestibular training, Canalith repositioning, Aquatic Therapy, Dry Needling, Electrical stimulation, Cryotherapy, Moist heat, Taping, Manual therapy, and Re-evaluation  PLAN FOR NEXT SESSION: MCTSIB, DGI, reassess R [facility]  Janene Harvey, PT, DPT 05/30/22 12:10 PM  [facility] [address], [place], [place] [address] Phone # [phone] Fax # [phone]

## 2022-05-30 ENCOUNTER — Encounter: Payer: Self-pay | Admitting: Physical Therapy

## 2022-05-30 ENCOUNTER — Other Ambulatory Visit: Payer: Self-pay

## 2022-05-30 ENCOUNTER — Ambulatory Visit: Payer: Medicare Other | Attending: Family Medicine | Admitting: Physical Therapy

## 2022-05-30 DIAGNOSIS — R42 Dizziness and giddiness: Secondary | ICD-10-CM | POA: Insufficient documentation

## 2022-05-30 DIAGNOSIS — R2681 Unsteadiness on feet: Secondary | ICD-10-CM | POA: Insufficient documentation

## 2022-05-30 DIAGNOSIS — H8111 Benign paroxysmal vertigo, right ear: Secondary | ICD-10-CM | POA: Insufficient documentation

## 2022-06-02 ENCOUNTER — Ambulatory Visit
Admission: RE | Admit: 2022-06-02 | Discharge: 2022-06-02 | Disposition: A | Discharge status: Home / Self Care | Payer: Medicare Other | Source: Ambulatory Visit | Attending: Obstetrics | Admitting: Obstetrics

## 2022-06-02 DIAGNOSIS — M8589 Other specified disorders of bone density and structure, multiple sites: Secondary | ICD-10-CM | POA: Diagnosis not present

## 2022-06-02 DIAGNOSIS — Z780 Asymptomatic menopausal state: Secondary | ICD-10-CM | POA: Diagnosis not present

## 2022-06-02 DIAGNOSIS — M8580 Other specified disorders of bone density and structure, unspecified site: Secondary | ICD-10-CM

## 2022-06-05 NOTE — Therapy (Signed)
OUTPATIENT PHYSICAL THERAPY VESTIBULAR TREATMENT     Patient Name: Meredith Escobar MRN: [id number] DOB:June 03, 1941, 81 y.o., female 3 Date: 06/06/2022  PCP: Orvan July, NP REFERRING PROVIDER: Lurline Del, DO   PT End of Session - 06/06/22 1019     Visit Number 2    Number of Visits 9    Date for PT Re-Evaluation 06/27/22    Authorization Type BCBS Medicare    PT Start Time [phone]    PT Stop Time 1018    PT Time Calculation (min) 44 min    Equipment Utilized During Treatment Gait belt    Activity Tolerance Patient tolerated treatment well    Behavior During Therapy WFL for tasks assessed/performed              Past Medical History:  Diagnosis Date   GERD (gastroesophageal reflux disease)    Hypothyroidism    Thyroid disease    Urticaria    Past Surgical History:  Procedure Laterality Date   APPENDECTOMY  1955   HAND SURGERY  12/14/2014   Hand palmar abscess deep irrigation and drainage   KNEE ARTHROSCOPY     Right Knee-1992 and 2007; Left Knee- 2001   KNEE SURGERY Right 11/16/2009   LAPAROSCOPIC CHOLECYSTECTOMY  02/2004   OTHER SURGICAL HISTORY  1988   Hysterectomy   PATELLECTOMY  11/16/2009   Partial Patellectomy   SURGERY OF LIP  2003   TUBAL LIGATION  1980   Patient Active Problem List   Diagnosis Date Noted   Bilateral impacted cerumen 07/07/2016   Chronic seasonal allergic rhinitis 07/07/2016   ETD (Eustachian tube dysfunction), bilateral 07/07/2016    ONSET DATE: May 2023  REFERRING DIAG: H81.10 (ICD-10-CM) - BPPV (benign paroxysmal positional vertigo)  THERAPY DIAG:  BPPV (benign paroxysmal positional vertigo), right  Dizziness and giddiness  Unsteadiness on feet  Rationale for Evaluation and Treatment Rehabilitation  SUBJECTIVE:   SUBJECTIVE STATEMENT: Reports that dizziness has improved a lot- was able to cut her toenails and is continuing to go to the gym. Turning at night did not bother her last night. Sometimes when getting  up after reading in bed or sitting up out of a chair may bring it on at times.   Pt accompanied by: self  PERTINENT HISTORY: GERD, hypothyroidism, R knee scope, partial patellectomy 2011   PAIN:  Are you having pain? No  PRECAUTIONS: None  PATIENT GOALS improve dizziness   OBJECTIVE:     TODAY'S TREATMENT: 06/06/22   M-CTSIB  Condition 1: Firm Surface, EO 30 Sec, Normal Sway  Condition 2: Firm Surface, EC 30 Sec, Mild Sway  Condition 3: Foam Surface, EO 30 Sec, Mild Sway  Condition 4: Foam Surface, EC 30 Sec, Moderate Sway     [facility] PT Assessment - 06/06/22 0001       Standardized Balance Assessment   Standardized Balance Assessment Dynamic Gait Index      Dynamic Gait Index   Level Surface Mild Impairment    Change in Gait Speed Normal    Gait with Horizontal Head Turns Normal    Gait with Vertical Head Turns Mild Impairment    Gait and Pivot Turn Normal    Step Over Obstacle Moderate Impairment    Step Around Obstacles Normal    Steps Normal    Total Score 20             Activity Comments  R DH R upbeating torsional nystagmus lasting ~20 sec  R epley Tolerated  well; retropulsion upon sitting up which resolved within seconds   R DH R upbeating torsional nystagmus lasting ~5-10 sec  R epley Tolerated well           HOME EXERCISE PROGRAM Last updated: 06/06/22 Access Code: [email or web address] URL: [email or web address] Date: 06/06/2022 Prepared by: [facility]  Exercises - Brandt-Daroff Vestibular Exercise  - 1 x daily - 5 x weekly - 2 sets - 3-5 reps   PATIENT EDUCATION: Education details: HEP, answered patient's questions on BPPV treatment  Person educated: Patient Education method: Explanation, Demonstration, Tactile cues, Verbal cues, and Handouts Education comprehension: verbalized understanding   Below measures were taken at time of initial evaluation unless otherwise specified:   DIAGNOSTIC  FINDINGS: none recent  COGNITION: Overall cognitive status: Within functional limits for tasks assessed   SENSATION: Reports intermittent N/T in the R hand- patient shows R UE EMG results from 2016 which was normal  POSTURE: rounded shoulders   GAIT: Gait pattern:  WNL Assistive device utilized: None Level of assistance: Complete Independence   FUNCTIONAL TESTs:  NT  PATIENT SURVEYS:  FOTO [phone]   VESTIBULAR ASSESSMENT   GENERAL OBSERVATION: patient wears readers PRN    OCULOMOTOR EXAM:   Ocular Alignment: normal   Ocular ROM: No Limitations   Spontaneous Nystagmus: absent   Gaze-Induced Nystagmus: absent   Smooth Pursuits: intact and feeling "not normal"   Saccades: hypometric/undershoots to L; other directions intact; c/o feeling "not normal"      VESTIBULAR - OCULAR REFLEX:    Slow VOR: Normal and Comment: c/o "clicking" in the neck    VOR Cancellation: Normal   Head-Impulse Test: HIT Right: covert positive HIT Left: covert positive    POSITIONAL TESTING:   Right Roll Test: Negative Left Roll Test: negative Right Dix-Hallpike: R upbeating torsional nystagmus; Duration:10 sec Left Dix-Hallpike: negative      VESTIBULAR TREATMENT:  Canalith Repositioning:   Epley Right: Number of Reps: 1, Response to Treatment: symptoms improved, and Comment: tolerated well; some dizziness upon sitting up   PATIENT EDUCATION: Education details: prognosis, POC, edu on BPPV & labyrinth anatomy  Person educated: Patient Education method: Explanation and Demonstration Education comprehension: verbalized understanding   GOALS: Goals reviewed with patient? Yes  SHORT TERM GOALS: Target date: 06/13/2022  Patient to be independent with initial HEP. Baseline: HEP initiated Goal status: IN PROGRESS    LONG TERM GOALS: Target date: 06/27/2022  Patient to be independent with advanced HEP. Baseline: Not yet initiated  Goal status: IN PROGRESS  Patient will  report 0/10 dizziness with bed mobility.  Baseline: Symptomatic  Goal status: IN PROGRESS  Patient to demonstrate mild-moderate sway with M-CTSIB condition with eyes closed/foam surface in order to improve safety in environments with uneven surfaces and dim lighting. Baseline: moderate sway 06/06/22 Goal status: IN PROGRESS 06/06/22  Patient to score at least 20/24 on DGI in order to decrease risk of falls. Baseline: NT Goal status: MET 06/06/22  Patient to score at least 70 on FOTO in order to indicate improved functional outcomes.  Baseline: 67 Goal status: IN PROGRESS   ASSESSMENT:  CLINICAL IMPRESSION: Patient arrived to session with report of improved dizziness as she was able to perform bending activities and rolling in bed without dizziness. Still notes some mild dizziness when getting out of bed or standing from a chair. Patient scored 20/24 on DGI, indicating decreased risk of falls. Balance test on foam revealed slightly increased difficulty using vestibular system  for balance. R DH was positive for R canalithiasis, treated with R Epley x2. Patient tolerated treatment well and was educated on habituation to work on at home.  Patient reported understanding of all edu provided and without complaints at end of session.      OBJECTIVE IMPAIRMENTS decreased balance and dizziness.   ACTIVITY LIMITATIONS carrying, lifting, bending, transfers, bed mobility, dressing, and reach over head  PARTICIPATION LIMITATIONS: meal prep, cleaning, laundry, driving, shopping, and community activity  PERSONAL FACTORS Age, Past/current experiences, Sex, Time since onset of injury/illness/exacerbation, and 3+ comorbidities: GERD, hypothyroidism, R knee scope, partial patellectomy 2011  are also affecting patient's functional outcome.   REHAB POTENTIAL: Good  CLINICAL DECISION MAKING: Evolving/moderate complexity  EVALUATION COMPLEXITY: Moderate   PLAN: PT FREQUENCY: 1-2x/week  PT  DURATION: 4 weeks  PLANNED INTERVENTIONS: Therapeutic exercises, Therapeutic activity, Neuromuscular re-education, Balance training, Gait training, Patient/Family education, Self Care, Joint mobilization, Stair training, Vestibular training, Canalith repositioning, Aquatic Therapy, Dry Needling, Electrical stimulation, Cryotherapy, Moist heat, Taping, Manual therapy, and Re-evaluation  PLAN FOR NEXT SESSION: reassess R DH and brandt darroff   Janene Harvey, PT, DPT 06/06/22 10:20 AM  [facility] at [facility] [address] [place], [place] [address] Phone # [phone] Fax # [phone]

## 2022-06-06 ENCOUNTER — Encounter: Payer: Self-pay | Admitting: Physical Therapy

## 2022-06-06 ENCOUNTER — Ambulatory Visit: Payer: Medicare Other | Admitting: Physical Therapy

## 2022-06-06 DIAGNOSIS — H8111 Benign paroxysmal vertigo, right ear: Secondary | ICD-10-CM

## 2022-06-06 DIAGNOSIS — R42 Dizziness and giddiness: Secondary | ICD-10-CM

## 2022-06-06 DIAGNOSIS — R2681 Unsteadiness on feet: Secondary | ICD-10-CM | POA: Diagnosis not present

## 2022-06-09 NOTE — Therapy (Signed)
OUTPATIENT PHYSICAL THERAPY VESTIBULAR TREATMENT     Patient Name: Meredith Escobar MRN: [id number] DOB:[birth year], 81 y.o., female Today's Date: 06/09/2022  PCP: Janace Aris, NP REFERRING PROVIDER: Jackelyn Poling, DO      Past Medical History:  Diagnosis Date   GERD (gastroesophageal reflux disease)    Hypothyroidism    Thyroid disease    Urticaria    Past Surgical History:  Procedure Laterality Date   APPENDECTOMY  1955   HAND SURGERY  12/14/2014   Hand palmar abscess deep irrigation and drainage   KNEE ARTHROSCOPY     Right Knee-1992 and 2007; Left Knee- 2001   KNEE SURGERY Right 11/16/2009   LAPAROSCOPIC CHOLECYSTECTOMY  02/2004   OTHER SURGICAL HISTORY  1988   Hysterectomy   PATELLECTOMY  11/16/2009   Partial Patellectomy   SURGERY OF LIP  2003   TUBAL LIGATION  1980   Patient Active Problem List   Diagnosis Date Noted   Bilateral impacted cerumen 07/07/2016   Chronic seasonal allergic rhinitis 07/07/2016   ETD (Eustachian tube dysfunction), bilateral 07/07/2016    ONSET DATE: May 2023  REFERRING DIAG: H81.10 (ICD-10-CM) - BPPV (benign paroxysmal positional vertigo)  THERAPY DIAG:  No diagnosis found.  Rationale for Evaluation and Treatment Rehabilitation  SUBJECTIVE:   SUBJECTIVE STATEMENT: Reports that dizziness has improved a lot- was able to cut her toenails and is continuing to go to the gym. Turning at night did not bother her last night. Sometimes when getting up after reading in bed or sitting up out of a chair may bring it on at times.   Pt accompanied by: self  PERTINENT HISTORY: GERD, hypothyroidism, R knee scope, partial patellectomy 2011   PAIN:  Are you having pain? No  PRECAUTIONS: None  PATIENT GOALS improve dizziness   OBJECTIVE:     TODAY'S TREATMENT: 06/12/22 Activity Comments                       HOME EXERCISE PROGRAM Last updated: 06/06/22 Access Code: [email or web address] URL:  [email or web address] Date: 06/06/2022 Prepared by: [facility]  Exercises - Brandt-Daroff Vestibular Exercise  - 1 x daily - 5 x weekly - 2 sets - 3-5 reps    Below measures were taken at time of initial evaluation unless otherwise specified:   DIAGNOSTIC FINDINGS: none recent  COGNITION: Overall cognitive status: Within functional limits for tasks assessed   SENSATION: Reports intermittent N/T in the R hand- patient shows R UE EMG results from 2016 which was normal  POSTURE: rounded shoulders   GAIT: Gait pattern:  WNL Assistive device utilized: None Level of assistance: Complete Independence   FUNCTIONAL TESTs:  NT  PATIENT SURVEYS:  FOTO [phone]   VESTIBULAR ASSESSMENT   GENERAL OBSERVATION: patient wears readers PRN    OCULOMOTOR EXAM:   Ocular Alignment: normal   Ocular ROM: No Limitations   Spontaneous Nystagmus: absent   Gaze-Induced Nystagmus: absent   Smooth Pursuits: intact and feeling "not normal"   Saccades: hypometric/undershoots to L; other directions intact; c/o feeling "not normal"      VESTIBULAR - OCULAR REFLEX:    Slow VOR: Normal and Comment: c/o "clicking" in the neck    VOR Cancellation: Normal   Head-Impulse Test: HIT Right: covert positive HIT Left: covert positive    POSITIONAL TESTING:   Right Roll Test: Negative Left Roll Test: negative Right Dix-Hallpike: R upbeating torsional nystagmus; Duration:10 sec Left Dix-Hallpike: negative  VESTIBULAR TREATMENT:  Canalith Repositioning:   Epley Right: Number of Reps: 1, Response to Treatment: symptoms improved, and Comment: tolerated well; some dizziness upon sitting up   PATIENT EDUCATION: Education details: prognosis, POC, edu on BPPV & labyrinth anatomy  Person educated: Patient Education method: Explanation and Demonstration Education comprehension: verbalized understanding   GOALS: Goals reviewed with patient?  Yes  SHORT TERM GOALS: Target date: 06/13/2022  Patient to be independent with initial HEP. Baseline: HEP initiated Goal status: IN PROGRESS    LONG TERM GOALS: Target date: 06/27/2022  Patient to be independent with advanced HEP. Baseline: Not yet initiated  Goal status: IN PROGRESS  Patient will report 0/10 dizziness with bed mobility.  Baseline: Symptomatic  Goal status: IN PROGRESS  Patient to demonstrate mild-moderate sway with M-CTSIB condition with eyes closed/foam surface in order to improve safety in environments with uneven surfaces and dim lighting. Baseline: moderate sway 06/06/22 Goal status: IN PROGRESS 06/06/22  Patient to score at least 20/24 on DGI in order to decrease risk of falls. Baseline: NT Goal status: MET 06/06/22  Patient to score at least 70 on FOTO in order to indicate improved functional outcomes.  Baseline: 67 Goal status: IN PROGRESS   ASSESSMENT:  CLINICAL IMPRESSION: Patient arrived to session with report of improved dizziness as she was able to perform bending activities and rolling in bed without dizziness. Still notes some mild dizziness when getting out of bed or standing from a chair. Patient scored 20/24 on DGI, indicating decreased risk of falls. Balance test on foam revealed slightly increased difficulty using vestibular system for balance. R DH was positive for R canalithiasis, treated with R Epley x2. Patient tolerated treatment well and was educated on habituation to work on at home.  Patient reported understanding of all edu provided and without complaints at end of session.      OBJECTIVE IMPAIRMENTS decreased balance and dizziness.   ACTIVITY LIMITATIONS carrying, lifting, bending, transfers, bed mobility, dressing, and reach over head  PARTICIPATION LIMITATIONS: meal prep, cleaning, laundry, driving, shopping, and community activity  PERSONAL FACTORS Age, Past/current experiences, Sex, Time since onset of  injury/illness/exacerbation, and 3+ comorbidities: GERD, hypothyroidism, R knee scope, partial patellectomy 2011  are also affecting patient's functional outcome.   REHAB POTENTIAL: Good  CLINICAL DECISION MAKING: Evolving/moderate complexity  EVALUATION COMPLEXITY: Moderate   PLAN: PT FREQUENCY: 1-2x/week  PT DURATION: 4 weeks  PLANNED INTERVENTIONS: Therapeutic exercises, Therapeutic activity, Neuromuscular re-education, Balance training, Gait training, Patient/Family education, Self Care, Joint mobilization, Stair training, Vestibular training, Canalith repositioning, Aquatic Therapy, Dry Needling, Electrical stimulation, Cryotherapy, Moist heat, Taping, Manual therapy, and Re-evaluation  PLAN FOR NEXT SESSION: reassess R DH and brandt darroff   Janene Harvey, PT, DPT 06/09/22 7:57 AM  [facility] at [facility] [address], [place], [place] [address] Phone # [phone] Fax # [phone]

## 2022-06-12 ENCOUNTER — Ambulatory Visit: Payer: Medicare Other | Attending: Family Medicine | Admitting: Physical Therapy

## 2022-06-12 DIAGNOSIS — R2681 Unsteadiness on feet: Secondary | ICD-10-CM | POA: Insufficient documentation

## 2022-06-12 DIAGNOSIS — R42 Dizziness and giddiness: Secondary | ICD-10-CM | POA: Diagnosis not present

## 2022-06-12 DIAGNOSIS — H8111 Benign paroxysmal vertigo, right ear: Secondary | ICD-10-CM | POA: Diagnosis not present

## 2022-06-16 NOTE — Therapy (Signed)
OUTPATIENT PHYSICAL THERAPY VESTIBULAR DISCHARGE SUMMARY     Patient Name: Meredith Escobar MRN: [id number] DOB:04-Jan-1941, 81 y.o., female 37 Date: 06/19/2022  PCP: Orvan July, NP REFERRING PROVIDER: Lurline Del, DO   Progress Note Reporting Period 05/30/22 to 06/19/22  See note below for Objective Data and Assessment of Progress/Goals.        PT End of Session - 06/19/22 0947     Visit Number 4    Number of Visits 9    Date for PT Re-Evaluation 06/27/22    Authorization Type BCBS Medicare    PT Start Time 0929    PT Stop Time 0945    PT Time Calculation (min) 16 min    Activity Tolerance Patient tolerated treatment well    Behavior During Therapy WFL for tasks assessed/performed                Past Medical History:  Diagnosis Date   GERD (gastroesophageal reflux disease)    Hypothyroidism    Thyroid disease    Urticaria    Past Surgical History:  Procedure Laterality Date   APPENDECTOMY  1955   HAND SURGERY  12/14/2014   Hand palmar abscess deep irrigation and drainage   KNEE ARTHROSCOPY     Right Knee-1992 and 2007; Left Knee- 2001   KNEE SURGERY Right 11/16/2009   LAPAROSCOPIC CHOLECYSTECTOMY  02/2004   OTHER SURGICAL HISTORY  1988   Hysterectomy   PATELLECTOMY  11/16/2009   Partial Patellectomy   SURGERY OF LIP  2003   TUBAL LIGATION  1980   Patient Active Problem List   Diagnosis Date Noted   Bilateral impacted cerumen 07/07/2016   Chronic seasonal allergic rhinitis 07/07/2016   ETD (Eustachian tube dysfunction), bilateral 07/07/2016    ONSET DATE: May 2023  REFERRING DIAG: H81.10 (ICD-10-CM) - BPPV (benign paroxysmal positional vertigo)  THERAPY DIAG:  BPPV (benign paroxysmal positional vertigo), right  Dizziness and giddiness  Unsteadiness on feet  Rationale for Evaluation and Treatment Rehabilitation  SUBJECTIVE:   SUBJECTIVE STATEMENT: Still "zero" dizziness. Denies dizziness with rolling in bed. Has been doing her  HEP.   Pt accompanied by: self  PERTINENT HISTORY: GERD, hypothyroidism, R knee scope, partial patellectomy 2011   PAIN:  Are you having pain? No  PRECAUTIONS: None  PATIENT GOALS improve dizziness   OBJECTIVE:     TODAY'S TREATMENT: 06/19/22 Activity Comments  R DH Negative; No dizziness  FOTO [phone]     M-CTSIB  Condition 1: Firm Surface, EO 30 Sec, Normal Sway  Condition 2: Firm Surface, EC 30 Sec, Normal and Mild Sway  Condition 3: Foam Surface, EO 30 Sec, Normal Sway  Condition 4: Foam Surface, EC 30 Sec, Mild Sway   PATIENT EDUCATION: Education details: edu on progress towards goals. Advised to discontinue HEP if no symptoms are brought on with exercises Person educated: Patient Education method: Explanation Education comprehension: verbalized understanding   HOME EXERCISE PROGRAM Last updated: 06/12/22 Access Code: [email or web address] URL: [email or web address] Date: 06/12/2022 Prepared by: [facility]  Exercises - Brandt-Daroff Vestibular Exercise  - 1 x daily - 5 x weekly - 2 sets - 3-5 reps - Standing Gaze Stabilization with Head Rotation  - 1 x daily - 5 x weekly - 2-3 sets - 30 sec hold - Standing Gaze Stabilization with Head Nod  - 1 x daily - 5 x weekly - 2-3 sets - 30 sec hold      Below measures  were taken at time of initial evaluation unless otherwise specified:   DIAGNOSTIC FINDINGS: none recent  COGNITION: Overall cognitive status: Within functional limits for tasks assessed   SENSATION: Reports intermittent N/T in the R hand- patient shows R UE EMG results from 2016 which was normal  POSTURE: rounded shoulders   GAIT: Gait pattern:  WNL Assistive device utilized: None Level of assistance: Complete Independence   FUNCTIONAL TESTs:  NT  PATIENT SURVEYS:  FOTO [phone]   VESTIBULAR ASSESSMENT   GENERAL OBSERVATION: patient wears readers PRN    OCULOMOTOR EXAM:   Ocular  Alignment: normal   Ocular ROM: No Limitations   Spontaneous Nystagmus: absent   Gaze-Induced Nystagmus: absent   Smooth Pursuits: intact and feeling "not normal"   Saccades: hypometric/undershoots to L; other directions intact; c/o feeling "not normal"      VESTIBULAR - OCULAR REFLEX:    Slow VOR: Normal and Comment: c/o "clicking" in the neck    VOR Cancellation: Normal   Head-Impulse Test: HIT Right: covert positive HIT Left: covert positive    POSITIONAL TESTING:   Right Roll Test: Negative Left Roll Test: negative Right Dix-Hallpike: R upbeating torsional nystagmus; Duration:10 sec Left Dix-Hallpike: negative      VESTIBULAR TREATMENT:  Canalith Repositioning:   Epley Right: Number of Reps: 1, Response to Treatment: symptoms improved, and Comment: tolerated well; some dizziness upon sitting up   PATIENT EDUCATION: Education details: prognosis, POC, edu on BPPV & labyrinth anatomy  Person educated: Patient Education method: Explanation and Demonstration Education comprehension: verbalized understanding   GOALS: Goals reviewed with patient? Yes  SHORT TERM GOALS: Target date: 06/13/2022  Patient to be independent with initial HEP. Baseline: HEP initiated Goal status: MET 06/19/22    LONG TERM GOALS: Target date: 06/27/2022  Patient to be independent with advanced HEP. Baseline: Not yet initiated  Goal status: MET 06/19/22  Patient will report 0/10 dizziness with bed mobility.  Baseline: Symptomatic  Goal status: MET 06/19/22  Patient to demonstrate mild-moderate sway with M-CTSIB condition with eyes closed/foam surface in order to improve safety in environments with uneven surfaces and dim lighting. Baseline: moderate sway 06/06/22; mild sway 06/19/22 Goal status: MET 06/19/22  Patient to score at least 20/24 on DGI in order to decrease risk of falls. Baseline: NT Goal status: MET 06/06/22  Patient to score at least 70 on FOTO in order to indicate  improved functional outcomes.  Baseline: 67 Goal status: NOT MET 06/19/22   ASSESSMENT:  CLINICAL IMPRESSION:  Patient arrived to session with report of continued resolution of dizziness. Re-test of R DH was negative. Patient also demonstrated improved stability with multisensory balance testing today. Patient has met most goals at this time and is asymptomatic. Ready for DC at this time.     OBJECTIVE IMPAIRMENTS decreased balance and dizziness.   ACTIVITY LIMITATIONS carrying, lifting, bending, transfers, bed mobility, dressing, and reach over head  PARTICIPATION LIMITATIONS: meal prep, cleaning, laundry, driving, shopping, and community activity  PERSONAL FACTORS Age, Past/current experiences, Sex, Time since onset of injury/illness/exacerbation, and 3+ comorbidities: GERD, hypothyroidism, R knee scope, partial patellectomy 2011  are also affecting patient's functional outcome.   REHAB POTENTIAL: Good  CLINICAL DECISION MAKING: Evolving/moderate complexity  EVALUATION COMPLEXITY: Moderate   PLAN: PT FREQUENCY: 1-2x/week  PT DURATION: 4 weeks  PLANNED INTERVENTIONS: Therapeutic exercises, Therapeutic activity, Neuromuscular re-education, Balance training, Gait training, Patient/Family education, Self Care, Joint mobilization, Stair training, Vestibular training, Canalith repositioning, Aquatic Therapy, Dry Needling, Electrical stimulation, Cryotherapy, Moist  heat, Taping, Manual therapy, and Re-evaluation  PLAN FOR NEXT SESSION: DC at this time   PHYSICAL THERAPY DISCHARGE SUMMARY  Visits from Start of Care: 4  Current functional level related to goals / functional outcomes: See above clinical impression   Remaining deficits: none   Education / Equipment: HEP  Plan: Patient agrees to discharge.  Patient goals were partiallymet. Patient is being discharged due to meeting the stated rehab goals.        Janene Harvey, PT, DPT 06/19/22 9:52 AM  [facility] [address] [place], [place], [place] [address] Phone # [phone] Fax # [phone]

## 2022-06-19 ENCOUNTER — Encounter: Payer: Self-pay | Admitting: Physical Therapy

## 2022-06-19 ENCOUNTER — Ambulatory Visit: Payer: Medicare Other | Admitting: Physical Therapy

## 2022-06-19 DIAGNOSIS — R42 Dizziness and giddiness: Secondary | ICD-10-CM | POA: Diagnosis not present

## 2022-06-19 DIAGNOSIS — H8111 Benign paroxysmal vertigo, right ear: Secondary | ICD-10-CM

## 2022-06-19 DIAGNOSIS — R2681 Unsteadiness on feet: Secondary | ICD-10-CM

## 2022-08-02 DIAGNOSIS — H8110 Benign paroxysmal vertigo, unspecified ear: Secondary | ICD-10-CM | POA: Diagnosis not present

## 2022-08-02 DIAGNOSIS — Z6831 Body mass index (BMI) 31.0-31.9, adult: Secondary | ICD-10-CM | POA: Diagnosis not present

## 2022-08-02 DIAGNOSIS — B009 Herpesviral infection, unspecified: Secondary | ICD-10-CM | POA: Diagnosis not present

## 2022-08-17 DIAGNOSIS — R051 Acute cough: Secondary | ICD-10-CM | POA: Diagnosis not present

## 2022-08-17 DIAGNOSIS — Z6831 Body mass index (BMI) 31.0-31.9, adult: Secondary | ICD-10-CM | POA: Diagnosis not present

## 2022-11-15 DIAGNOSIS — H7392 Unspecified disorder of tympanic membrane, left ear: Secondary | ICD-10-CM | POA: Diagnosis not present

## 2022-11-15 DIAGNOSIS — I10 Essential (primary) hypertension: Secondary | ICD-10-CM | POA: Diagnosis not present

## 2022-11-22 DIAGNOSIS — H93292 Other abnormal auditory perceptions, left ear: Secondary | ICD-10-CM | POA: Diagnosis not present

## 2022-12-04 DIAGNOSIS — Z01419 Encounter for gynecological examination (general) (routine) without abnormal findings: Secondary | ICD-10-CM | POA: Diagnosis not present

## 2022-12-13 DIAGNOSIS — H93292 Other abnormal auditory perceptions, left ear: Secondary | ICD-10-CM | POA: Diagnosis not present

## 2022-12-13 DIAGNOSIS — Z1231 Encounter for screening mammogram for malignant neoplasm of breast: Secondary | ICD-10-CM | POA: Diagnosis not present

## 2023-01-30 DIAGNOSIS — H903 Sensorineural hearing loss, bilateral: Secondary | ICD-10-CM | POA: Diagnosis not present

## 2023-01-30 DIAGNOSIS — H6122 Impacted cerumen, left ear: Secondary | ICD-10-CM | POA: Diagnosis not present

## 2023-01-30 DIAGNOSIS — Z0110 Encounter for examination of ears and hearing without abnormal findings: Secondary | ICD-10-CM | POA: Diagnosis not present

## 2023-02-21 DIAGNOSIS — L219 Seborrheic dermatitis, unspecified: Secondary | ICD-10-CM | POA: Diagnosis not present

## 2023-02-21 DIAGNOSIS — E559 Vitamin D deficiency, unspecified: Secondary | ICD-10-CM | POA: Diagnosis not present

## 2023-02-21 DIAGNOSIS — E039 Hypothyroidism, unspecified: Secondary | ICD-10-CM | POA: Diagnosis not present

## 2023-02-21 DIAGNOSIS — Z0000 Encounter for general adult medical examination without abnormal findings: Secondary | ICD-10-CM | POA: Diagnosis not present

## 2023-02-21 DIAGNOSIS — N1831 Chronic kidney disease, stage 3a: Secondary | ICD-10-CM | POA: Diagnosis not present

## 2023-03-13 DIAGNOSIS — R630 Anorexia: Secondary | ICD-10-CM | POA: Diagnosis not present

## 2023-03-13 DIAGNOSIS — R5383 Other fatigue: Secondary | ICD-10-CM | POA: Diagnosis not present

## 2023-03-14 DIAGNOSIS — R630 Anorexia: Secondary | ICD-10-CM | POA: Diagnosis not present

## 2023-03-14 DIAGNOSIS — R5383 Other fatigue: Secondary | ICD-10-CM | POA: Diagnosis not present

## 2023-04-12 DIAGNOSIS — A048 Other specified bacterial intestinal infections: Secondary | ICD-10-CM | POA: Diagnosis not present

## 2023-04-13 DIAGNOSIS — G8929 Other chronic pain: Secondary | ICD-10-CM | POA: Diagnosis not present

## 2023-04-24 DIAGNOSIS — H10413 Chronic giant papillary conjunctivitis, bilateral: Secondary | ICD-10-CM | POA: Diagnosis not present

## 2023-04-26 DIAGNOSIS — A048 Other specified bacterial intestinal infections: Secondary | ICD-10-CM | POA: Diagnosis not present

## 2023-05-24 DIAGNOSIS — L989 Disorder of the skin and subcutaneous tissue, unspecified: Secondary | ICD-10-CM | POA: Diagnosis not present

## 2023-06-07 DIAGNOSIS — R1013 Epigastric pain: Secondary | ICD-10-CM | POA: Diagnosis not present

## 2023-06-26 ENCOUNTER — Telehealth: Payer: Self-pay | Admitting: Cardiology

## 2023-06-26 NOTE — Telephone Encounter (Signed)
Returned call to patient who states she's been having intermittent sternal pain that seems to be brought about with activity. Episodes are not worsening in nature, but are happening frequently. She has had much GI issues and H. Pylori infection and has been writing off her symptoms. Last seen by Ganji on 01/31/22 and advised for PRN follow-up. Denies all other symptoms, specifically no SOB, shoulder/arm pain, nausea/vomiting or diaphoresis. BP 131/68, HR 49. Pt requesting app to see Ganji to discuss. Scheduled for 06/28/23.

## 2023-06-26 NOTE — Telephone Encounter (Signed)
Pt c/o of Chest Pain: STAT if active CP, including tightness, pressure, jaw pain, radiating pain to shoulder/upper arm/back, CP unrelieved by Nitro. Symptoms reported of SOB, nausea, vomiting, sweating.  1. Are you having CP right now? Yes, started today   2. Are you experiencing any other symptoms (ex. SOB, nausea, vomiting, sweating)? No   3. Is your CP continuous or coming and going? Continuous   4. Have you taken Nitroglycerin? No   5. How long have you been experiencing CP? This morning   6. If NO CP at time of call then end call with telling Pt to call back or call 911 if Chest pain returns prior to return call from triage team.   Patient states she is having discomfort in the middle of her chest. Patient states the discomfort is not going away, but she is not experiencing any other symptoms.

## 2023-06-28 ENCOUNTER — Encounter: Payer: Self-pay | Admitting: Cardiology

## 2023-06-28 ENCOUNTER — Ambulatory Visit: Payer: Medicare Other | Attending: Cardiology | Admitting: Cardiology

## 2023-06-28 VITALS — BP 160/80 | HR 63 | Resp 16 | Ht 62.0 in | Wt 179.2 lb

## 2023-06-28 DIAGNOSIS — E7800 Pure hypercholesterolemia, unspecified: Secondary | ICD-10-CM

## 2023-06-28 DIAGNOSIS — I10 Essential (primary) hypertension: Secondary | ICD-10-CM

## 2023-06-28 DIAGNOSIS — R072 Precordial pain: Secondary | ICD-10-CM | POA: Diagnosis not present

## 2023-06-28 NOTE — Patient Instructions (Addendum)
Medication Instructions:  Your physician recommends that you continue on your current medications as directed. Please refer to the Current Medication list given to you today.  *If you need a refill on your cardiac medications before your next appointment, please call your pharmacy*  Lab Work: None ordered today.  Testing/Procedures: Your physician has requested that you have en exercise stress myoview. For further information please visit [email or web address] Please follow instruction sheet, as given.   Follow-Up: At [facility], you and your health needs are our priority.  As part of our continuing mission to provide you with exceptional heart care, we have created designated Provider Care Teams.  These Care Teams include your primary Cardiologist (physician) and Advanced Practice Providers (APPs -  Physician Assistants and Nurse Practitioners) who all work together to provide you with the care you need, when you need it.  Your next appointment:   3 month(s)  The format for your next appointment:   In Person  Provider:   Yates Decamp, MD {  Other Instructions Exercise Myoview (Stress Test) Instructions  Please arrive 15 minutes prior to your appointment time for registration and insurance purposes.   The test will take approximately 3 to 4 hours to complete; you may bring reading material.  If someone comes with you to your appointment, they will need to remain in the main lobby due to limited space in the testing area. **If you are pregnant or breastfeeding, please notify the nuclear lab prior to your appointment**   How to prepare for your Myocardial Perfusion Test: Do not eat or drink 3 hours prior to your test, except you may have water. Do not consume products containing caffeine (regular or decaffeinated) 12 hours prior to your test. (ex: coffee, chocolate, sodas, tea). Do bring a list of your current medications with you. Do wear comfortable clothes (no dresses or overalls)  and walking shoes, tennis shoes preferred (No heels or open toe shoes are allowed). Do NOT wear cologne, perfume, aftershave, or lotions (deodorant is allowed). If these instructions are not followed, your test will have to be rescheduled.   Please report to [address] for your test.  If you have questions or concerns about your appointment, you can call the Nuclear Lab at [phone].   If you cannot keep your appointment, please provide 24 hours notification to the Nuclear Lab, to avoid a possible $50 charge to your account.

## 2023-06-28 NOTE — Progress Notes (Signed)
Cardiology Office Note:  .   Date:  06/28/2023  ID:  Meredith Escobar, Meredith Escobar Apr 15, 1941, MRN [id number] PCP: Jackelyn Poling, DO  [facility] Providers Cardiologist:  Yates Decamp, MD    History of Present Illness: .   Meredith Escobar is a 82 y.o. Discussed the use of AI scribe software for clinical note transcription with the patient, who gave verbal consent to proceed.  History of Present Illness   Meredith Escobar, a patient with a history of H. pylori infection and a large hiatal hernia, presents with recent chest discomfort. She describes the discomfort as a sensation of "everything coming up" and localizes it under her diaphragm. She attributes this discomfort to her hiatal hernia. This episode of discomfort occurred four days prior to the current visit and has not recurred since.  The patient also has a history of hypertension, which she monitors at home and at the gym. Her readings are generally within normal range, suggesting well-controlled hypertension. She is currently on an ARB and diuretics for this condition.  In addition, Meredith Escobar has hypercholesterolemia with a recent LDL level of 167. However, she is reluctant to take statins or PCSK9 inhibitors due to her age and personal beliefs.  She exercises three to four times a week, including walking a mile each time, without experiencing any chest pain or shortness of breath. She also reports adherence to a healthy diet. E does admit that since chest pain episode, she stopped exercise as she was concerned.       Review of Systems  Cardiovascular:  Positive for chest pain. Negative for dyspnea on exertion and leg swelling.    Risk Assessment/Calculations:     HYPERTENSION CONTROL Vitals:   06/28/23 1128 06/28/23 1145  BP: (!) 168/78 (!) 160/80    The patient's blood pressure is elevated above target today.  In order to address the patient's elevated BP: Blood pressure will be monitored at home to determine if medication changes need to  be made.; The blood pressure is usually elevated in clinic.  Blood pressures monitored at home have been optimal.           No results found for: "CHOL", "HDL", "LDLCALC", "LDLDIRECT", "TRIG", "CHOLHDL" Lab Results  Component Value Date   NA 139 05/06/2021   K 4.2 05/06/2021   CO2 23 05/06/2021   GLUCOSE 100 (H) 05/06/2021   BUN 22 05/06/2021   CREATININE 1.01 (H) 05/06/2021   CALCIUM 10.4 (H) 05/06/2021   EGFR 57 (L) 05/06/2021   Lab Results  Component Value Date   HGB 14.3 12/05/2015   HCT 42.0 12/05/2015    External Labs:  Labs 04/30/2022:  A1c 6.1%.  TSH 3.420, normal.  BUN 19, creatinine 1.0, EGFR 57 mL.  Potassium 4.4.  Total cholesterol 253, triglycerides 229, HDL 42, LDL 167.  Physical Exam:   VS:  BP (!) 160/80   Pulse 63   Resp 16   Ht 5\' 2"  (1.575 m)   Wt 179 lb 3.2 oz (81.3 kg)   SpO2 98%   BMI 32.78 kg/m    Wt Readings from Last 3 Encounters:  06/28/23 179 lb 3.2 oz (81.3 kg)  01/31/22 175 lb (79.4 kg)  06/07/21 170 lb 12.8 oz (77.5 kg)     Physical Exam Constitutional:      Appearance: She is obese.  Neck:     Vascular: No carotid bruit or JVD.  Cardiovascular:     Rate and Rhythm: Normal rate and regular  rhythm.     Pulses: Intact distal pulses.     Heart sounds: Murmur heard.     Midsystolic murmur is present with a grade of 2/6 at the lower right sternal border.     No gallop.  Pulmonary:     Effort: Pulmonary effort is normal.     Breath sounds: Normal breath sounds.  Abdominal:     General: Bowel sounds are normal.     Palpations: Abdomen is soft.  Musculoskeletal:     Right lower leg: No edema.     Left lower leg: No edema.     Studies Reviewed: Marland Kitchen    EKG:    EKG Interpretation Date/Time:  Thursday June 28 2023 11:25:40 EDT Ventricular Rate:  59 PR Interval:  146 QRS Duration:  74 QT Interval:  410 QTC Calculation: 405 R Axis:   20  Text Interpretation: EKG 06/28/2023: Normal sinus rhythm at rate of 59 bpm,  isolated T wave inversion in lead III, otherwise normal EKG. Confirmed by Delrae Rend [phone]) on 06/28/2023 11:42:19 AM    Echocardiogram 05/06/2021: Normal LV systolic function with visual EF 60-65%. Left ventricle cavity is normal in size. Normal left ventricular wall thickness. Normal global wall motion. Doppler evidence of grade II (pseudonormal) diastolic dysfunction, elevated LAP. Moderate tricuspid regurgitation. Mild pulmonary hypertension. RVSP measures 39 mmHg. Compared to study 04/24/2018 G1 DD progressed to G2 with elevated LAP and TR/PHTN remains stable.  Carotid artery duplex 05/06/2021: Duplex suggests stenosis in the right internal carotid artery (1-15%). Duplex suggests stenosis in the right external carotid artery (<50%). Duplex suggests stenosis in the left internal carotid artery (1-15%). Duplex suggests stenosis in the left external carotid artery (<50%). Very mild plaque noted in bilateral carotid arteries. 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  Antegrade right vertebral artery flow. Antegrade left vertebral artery flow. No significant change from 04/21/2019. Follow up is appropriate if clinically indicated.  ASSESSMENT AND PLAN: .      ICD-10-CM   1. Precordial pain  R07.2 MYOCARDIAL PERFUSION IMAGING    Cardiac Stress Test: Informed Consent Details: Physician/Practitioner Attestation; Transcribe to consent form and obtain patient signature    2. Essential hypertension  I10 EKG 12-Lead    Cardiac Stress Test: Informed Consent Details: Physician/Practitioner Attestation; Transcribe to consent form and obtain patient signature    3. White coat syndrome with diagnosis of hypertension  I10     4. Pure hypercholesterolemia  E78.00 EKG 12-Lead    Cardiac Stress Test: Informed Consent Details: Physician/Practitioner Attestation; Transcribe to consent form and obtain patient signature      Assessment and Plan    Chest Pain Recent  episode of chest pain, possibly related to GERD or hiatal hernia. However, given untreated hypercholesterolemia, hypertension, and age, there is concern for angina pectoris. -Schedule a nuclear stress test after November 5th, 2024. -If chest pain recurs, patient to contact the office immediately.  Primary Hypertension Well-controlled with home recordings. Patient on ARB and diuretics. -Continue current medications.  Hypercholesterolemia LDL 167, patient is reluctant to start statin or PCSK9 inhibitors due to age and personal preference. -Respect patient's decision and continue to monitor.  Follow-up in 3 months. If nuclear stress test is markedly abnormal, will discuss further options with the patient. If normal, will continue watchful observation.       Signed,  Yates Decamp, MD, [facility] 06/28/2023, 10:10 PM [facility] [address] [place], [place] [address] Phone: [phone]. Fax:  [phone]

## 2023-07-05 ENCOUNTER — Encounter (HOSPITAL_COMMUNITY): Payer: Self-pay

## 2023-07-12 ENCOUNTER — Telehealth (HOSPITAL_COMMUNITY): Payer: Self-pay | Admitting: Cardiology

## 2023-07-12 NOTE — Telephone Encounter (Signed)
Just an FYI. We have made several attempts to contact this patient including sending a letter to schedule or reschedule their Myoview. We will be removing the patient from the echo/nuc WQ.   SENT LETTER THRU MY CHART [facility]  07/05/23 [facility] to schedule x 3 @ 11:48/[facility]  07/03/23 [facility] to schedule x 2 @ 8:16/[facility]  06/28/23 [facility] to schedule -pt in office and not scheduled [facility] 1:28/[facility]     Thank you

## 2023-08-30 ENCOUNTER — Other Ambulatory Visit: Payer: Self-pay | Admitting: Gastroenterology

## 2023-08-30 DIAGNOSIS — R1013 Epigastric pain: Secondary | ICD-10-CM

## 2023-08-30 DIAGNOSIS — K219 Gastro-esophageal reflux disease without esophagitis: Secondary | ICD-10-CM | POA: Diagnosis not present

## 2023-08-31 ENCOUNTER — Telehealth (HOSPITAL_COMMUNITY): Payer: Self-pay | Admitting: *Deleted

## 2023-08-31 DIAGNOSIS — M5412 Radiculopathy, cervical region: Secondary | ICD-10-CM | POA: Diagnosis not present

## 2023-08-31 NOTE — Telephone Encounter (Signed)
Patient given detailed instructions per Myocardial Perfusion Study Information Sheet for the test on 09/03/23 at 7:45. Patient notified to arrive 15 minutes early and that it is imperative to arrive on time for appointment to keep from having the test rescheduled.  If you need to cancel or reschedule your appointment, please call the office within 24 hours of your appointment. . Patient verbalized understanding.Meredith Escobar

## 2023-09-03 ENCOUNTER — Ambulatory Visit (HOSPITAL_COMMUNITY): Payer: Medicare Other | Attending: Cardiology

## 2023-09-03 DIAGNOSIS — R072 Precordial pain: Secondary | ICD-10-CM

## 2023-09-03 LAB — MYOCARDIAL PERFUSION IMAGING
Base ST Depression (mm): 0 mm
LV dias vol: 46 mL (ref 46–106)
LV sys vol: 7 mL
Nuc Stress EF: 85 %
Peak HR: 77 {beats}/min
Rest HR: 56 {beats}/min
Rest Nuclear Isotope Dose: 10.4 mCi
SDS: 1
SRS: 0
SSS: 1
ST Depression (mm): 0 mm
Stress Nuclear Isotope Dose: 30.6 mCi
TID: 0.91

## 2023-09-03 MED ORDER — TECHNETIUM TC 99M TETROFOSMIN IV KIT
30.6000 | PACK | Freq: Once | INTRAVENOUS | Status: AC | PRN
  Administered 2023-09-03: 30.6 via INTRAVENOUS

## 2023-09-03 MED ORDER — TECHNETIUM TC 99M TETROFOSMIN IV KIT
10.4000 | PACK | Freq: Once | INTRAVENOUS | Status: AC | PRN
  Administered 2023-09-03: 10.4 via INTRAVENOUS

## 2023-09-03 MED ORDER — REGADENOSON 0.4 MG/5ML IV SOLN
0.4000 mg | Freq: Once | INTRAVENOUS | Status: AC
  Administered 2023-09-03: 0.4 mg via INTRAVENOUS

## 2023-09-03 NOTE — Progress Notes (Signed)
Normal stress, I am seeing her soon

## 2023-09-07 ENCOUNTER — Ambulatory Visit
Admission: RE | Admit: 2023-09-07 | Discharge: 2023-09-07 | Disposition: A | Discharge status: Home / Self Care | Payer: Medicare Other | Source: Ambulatory Visit | Attending: Gastroenterology | Admitting: Gastroenterology

## 2023-09-07 DIAGNOSIS — K449 Diaphragmatic hernia without obstruction or gangrene: Secondary | ICD-10-CM | POA: Diagnosis not present

## 2023-09-07 DIAGNOSIS — K5730 Diverticulosis of large intestine without perforation or abscess without bleeding: Secondary | ICD-10-CM | POA: Diagnosis not present

## 2023-09-07 DIAGNOSIS — R1013 Epigastric pain: Secondary | ICD-10-CM

## 2023-09-07 DIAGNOSIS — K769 Liver disease, unspecified: Secondary | ICD-10-CM | POA: Diagnosis not present

## 2023-09-07 DIAGNOSIS — R109 Unspecified abdominal pain: Secondary | ICD-10-CM | POA: Diagnosis not present

## 2023-09-07 MED ORDER — IOPAMIDOL (ISOVUE-300) INJECTION 61%
100.0000 mL | Freq: Once | INTRAVENOUS | Status: AC | PRN
  Administered 2023-09-07: 100 mL via INTRAVENOUS

## 2023-09-18 ENCOUNTER — Ambulatory Visit: Payer: Medicare Other | Admitting: Rehabilitative and Restorative Service Providers"

## 2023-09-18 DIAGNOSIS — J069 Acute upper respiratory infection, unspecified: Secondary | ICD-10-CM | POA: Diagnosis not present

## 2023-09-18 DIAGNOSIS — Z6832 Body mass index (BMI) 32.0-32.9, adult: Secondary | ICD-10-CM | POA: Diagnosis not present

## 2023-09-18 DIAGNOSIS — H6122 Impacted cerumen, left ear: Secondary | ICD-10-CM | POA: Diagnosis not present

## 2023-09-21 ENCOUNTER — Ambulatory Visit: Payer: Medicare Other | Admitting: Rehabilitative and Restorative Service Providers"

## 2023-10-12 ENCOUNTER — Encounter: Payer: Self-pay | Admitting: Cardiology

## 2023-10-12 ENCOUNTER — Ambulatory Visit: Payer: Medicare Other | Attending: Cardiology | Admitting: Cardiology

## 2023-10-12 VITALS — BP 160/76 | HR 58 | Resp 98 | Ht 61.0 in | Wt 178.6 lb

## 2023-10-12 DIAGNOSIS — E7800 Pure hypercholesterolemia, unspecified: Secondary | ICD-10-CM | POA: Diagnosis not present

## 2023-10-12 DIAGNOSIS — R079 Chest pain, unspecified: Secondary | ICD-10-CM | POA: Diagnosis not present

## 2023-10-12 DIAGNOSIS — K449 Diaphragmatic hernia without obstruction or gangrene: Secondary | ICD-10-CM | POA: Diagnosis not present

## 2023-10-12 DIAGNOSIS — I10 Essential (primary) hypertension: Secondary | ICD-10-CM | POA: Diagnosis not present

## 2023-10-12 DIAGNOSIS — K219 Gastro-esophageal reflux disease without esophagitis: Secondary | ICD-10-CM

## 2023-10-12 NOTE — Patient Instructions (Signed)
 Medication Instructions:  Your physician recommends that you continue on your current medications as directed. Please refer to the Current Medication list given to you today.  *If you need a refill on your cardiac medications before your next appointment, please call your pharmacy*   Lab Work: none If you have labs (blood work) drawn today and your tests are completely normal, you will receive your results only by: MyChart Message (if you have MyChart) OR A paper copy in the mail If you have any lab test that is abnormal or we need to change your treatment, we will call you to review the results.   Testing/Procedures: none   Follow-Up: At [facility], you and your health needs are our priority.  As part of our continuing mission to provide you with exceptional heart care, we have created designated Provider Care Teams.  These Care Teams include your primary Cardiologist (physician) and Advanced Practice Providers (APPs -  Physician Assistants and Nurse Practitioners) who all work together to provide you with the care you need, when you need it.  We recommend signing up for the patient portal called "MyChart".  Sign up information is provided on this After Visit Summary.  MyChart is used to connect with patients for Virtual Visits (Telemedicine).  Patients are able to view lab/test results, encounter notes, upcoming appointments, etc.  Non-urgent messages can be sent to your provider as well.   To learn more about what you can do with MyChart, go to [email or web address].    Your next appointment:   As needed  Provider:   Yates Decamp, MD     Other Instructions

## 2023-10-12 NOTE — Progress Notes (Signed)
Cardiology Office Note:  .   Date:  10/12/2023  ID:  Meredith Escobar, Meredith Escobar 24-May-1941, MRN [id number] PCP: Jackelyn Poling, DO  [facility] Providers Cardiologist:  Yates Decamp, MD   History of Present Illness: .   Meredith Escobar is a 83 y.o. Meredith Escobar, a patient with a history of H. pylori infection and a large hiatal hernia, presents with recent chest discomfort. She describes the discomfort as a sensation of "everything coming up" and localizes it under her diaphragm.  Past medical history significant for hypertension and hypercholesterolemia and she is reluctant to taking statins.  She is very active and exercises regularly.  In view of her precordial pain, she underwent echocardiogram and stress testing and presents for follow-up.  No new symptoms, states that her acid reflux symptoms have improved.  Patient also states that her blood pressure is very well-controlled at home, brings in her home blood pressure recordings which are always around 130 mmHg systolic.  Essentially asymptomatic except for occasional acid reflux and GERD.    Labs    External Labs:  PCP Labs 04/30/2022:   A1c 6.1%.  TSH 3.420, normal.   BUN 19, creatinine 1.0, EGFR 57 mL.  Potassium 4.4.   Total cholesterol 253, triglycerides 229, HDL 42, LDL 167.  Review of Systems  Cardiovascular:  Negative for chest pain, dyspnea on exertion and leg swelling.  Gastrointestinal:  Positive for heartburn.    Physical Exam:   VS:  BP (!) 160/76 (BP Location: Left Arm, Patient Position: Sitting, Cuff Size: Normal)   Pulse (!) 58   Resp (!) 98   Ht 5\' 1"  (1.549 m)   Wt 178 lb 9.6 oz (81 kg)   BMI 33.75 kg/m    Wt Readings from Last 3 Encounters:  10/12/23 178 lb 9.6 oz (81 kg)  09/03/23 177 lb (80.3 kg)  06/28/23 179 lb 3.2 oz (81.3 kg)     Physical Exam Neck:     Vascular: No carotid bruit or JVD.  Cardiovascular:     Rate and Rhythm: Normal rate and regular rhythm.     Pulses: Intact distal pulses.     Heart  sounds: Normal heart sounds. No murmur heard.    No gallop.  Pulmonary:     Effort: Pulmonary effort is normal.     Breath sounds: Normal breath sounds.  Abdominal:     General: Bowel sounds are normal.     Palpations: Abdomen is soft.  Musculoskeletal:     Right lower leg: No edema.     Left lower leg: No edema.     Studies Reviewed: .    MYOCARDIAL PERFUSION IMAGING 09/03/2023   The study is normal. The study is low risk.   Non specific ST-T wave changes   LV perfusion is normal. There is no evidence of ischemia. There is no evidence of infarction.   Left ventricular function is normal. Nuclear stress EF: 85%. The left ventricular ejection fraction is normal (55-65%). End diastolic cavity size is normal. End systolic cavity size is normal.  Echocardiogram 05/06/2021: Normal LV systolic function with visual EF 60-65%. Left ventricle cavity is normal in size. Normal left ventricular wall thickness. Normal global wall motion. Doppler evidence of grade II (pseudonormal) diastolic dysfunction, elevated LAP. Moderate tricuspid regurgitation. Mild pulmonary hypertension. RVSP measures 39 mmHg. Compared to study 04/24/2018 G1 DD progressed to G2 with elevated LAP and TR/PHTN remains stable.  EKG:   EKG 06/28/2023: Normal sinus rhythm at rate  of 59 bpm, isolated T wave inversion in lead III, otherwise normal EKG   Medications and allergies    Allergies  Allergen Reactions   Ampicillin Rash   Gabapentin Rash     Current Outpatient Medications:    Ascorbic Acid (VITAMIN C) 100 MG tablet, Take 100 mg by mouth daily., Disp: , Rfl:    Cholecalciferol (D3-1000) 25 MCG (1000 UT) capsule, Take 1,000 Units by mouth daily., Disp: , Rfl:    co-enzyme Q-10 30 MG capsule, Take 30 mg by mouth 3 (three) times daily., Disp: , Rfl:    Cyanocobalamin (B-12) 1000 MCG CAPS, Take 1 capsule by mouth daily., Disp: , Rfl:    fluticasone (FLONASE) 50 MCG/ACT nasal spray, Place 2 sprays into both  nostrils daily., Disp: , Rfl:    levocetirizine (XYZAL) 5 MG tablet, Take 2 tablets by mouth at bedtime., Disp: , Rfl:    levothyroxine (SYNTHROID) 50 MCG tablet, Take 50 mcg by mouth daily before breakfast., Disp: , Rfl:    NON FORMULARY, Keranew shampoo, Disp: , Rfl:    olmesartan (BENICAR) 20 MG tablet, Take 1 tablet (20 mg total) by mouth every evening., Disp: , Rfl:    zinc gluconate 50 MG tablet, Take 50 mg by mouth daily., Disp: , Rfl:    omeprazole (PRILOSEC) 40 MG capsule, Take 40 mg by mouth daily. (Patient not taking: Reported on 10/12/2023), Disp: , Rfl:    valACYclovir (VALTREX) 1000 MG tablet, Take 1,000 mg by mouth daily. (Patient not taking: Reported on 10/12/2023), Disp: , Rfl:    ASSESSMENT AND PLAN: .      ICD-10-CM   1. Chest pain due to GERD  K21.9    R07.9     2. Hiatal hernia  K44.9     3. Essential hypertension  I10     4. Pure hypercholesterolemia  E78.00       1. Chest pain due to GERD I reviewed the results of the stress test with the patient, she remains asymptomatic except for heartburn and which I suspect is due to a moderate-sized hiatal hernia.  Patient prefers to continue medical therapy for now and only if symptoms get worse she plans on surgery for hiatal hernia.  2. Hiatal hernia As dictated above she remained stable.  3. Essential hypertension Blood pressure was markedly elevated today however blood pressure at home has been very well-controlled.  Home recordings reviewed.  She is presently on Benicar 20 mg daily, continue the same.  4. Pure hypercholesterolemia She is not interested in starting statins.  Advised her she could try red yeast rice that would offer her some protection for hypercholesterolemia.  Fortunately she is 83 years of age in 1 week no clinical evidence of vascular disease and low risk stress test.  I will see her back on a as needed basis.   Signed,  Yates Decamp, MD, [facility] 10/12/2023, 4:14 PM [facility] [address] [place], [place] [address] Phone: [phone]. Fax:  [phone]

## 2023-10-18 DIAGNOSIS — K219 Gastro-esophageal reflux disease without esophagitis: Secondary | ICD-10-CM | POA: Diagnosis not present

## 2023-10-18 DIAGNOSIS — N1831 Chronic kidney disease, stage 3a: Secondary | ICD-10-CM | POA: Diagnosis not present

## 2023-10-18 DIAGNOSIS — E039 Hypothyroidism, unspecified: Secondary | ICD-10-CM | POA: Diagnosis not present

## 2023-10-18 DIAGNOSIS — I129 Hypertensive chronic kidney disease with stage 1 through stage 4 chronic kidney disease, or unspecified chronic kidney disease: Secondary | ICD-10-CM | POA: Diagnosis not present

## 2023-12-10 DIAGNOSIS — H11441 Conjunctival cysts, right eye: Secondary | ICD-10-CM | POA: Diagnosis not present

## 2023-12-10 DIAGNOSIS — H15101 Unspecified episcleritis, right eye: Secondary | ICD-10-CM | POA: Diagnosis not present

## 2023-12-14 DIAGNOSIS — Z1231 Encounter for screening mammogram for malignant neoplasm of breast: Secondary | ICD-10-CM | POA: Diagnosis not present

## 2023-12-17 ENCOUNTER — Other Ambulatory Visit: Payer: Self-pay | Admitting: Obstetrics and Gynecology

## 2023-12-17 DIAGNOSIS — M8580 Other specified disorders of bone density and structure, unspecified site: Secondary | ICD-10-CM

## 2024-01-10 ENCOUNTER — Other Ambulatory Visit: Payer: Self-pay

## 2024-01-10 ENCOUNTER — Ambulatory Visit: Attending: Family Medicine

## 2024-01-10 DIAGNOSIS — R293 Abnormal posture: Secondary | ICD-10-CM | POA: Diagnosis not present

## 2024-01-10 DIAGNOSIS — R252 Cramp and spasm: Secondary | ICD-10-CM | POA: Insufficient documentation

## 2024-01-10 DIAGNOSIS — M542 Cervicalgia: Secondary | ICD-10-CM | POA: Diagnosis not present

## 2024-01-10 DIAGNOSIS — Q159 Congenital malformation of eye, unspecified: Secondary | ICD-10-CM | POA: Diagnosis not present

## 2024-01-10 DIAGNOSIS — L219 Seborrheic dermatitis, unspecified: Secondary | ICD-10-CM | POA: Diagnosis not present

## 2024-01-10 DIAGNOSIS — I10 Essential (primary) hypertension: Secondary | ICD-10-CM | POA: Diagnosis not present

## 2024-01-10 DIAGNOSIS — M6281 Muscle weakness (generalized): Secondary | ICD-10-CM | POA: Diagnosis not present

## 2024-01-10 DIAGNOSIS — M549 Dorsalgia, unspecified: Secondary | ICD-10-CM | POA: Diagnosis not present

## 2024-01-10 NOTE — Therapy (Signed)
 OUTPATIENT PHYSICAL THERAPY CERVICAL EVALUATION   Patient Name: Meredith Escobar MRN: [id number] DOB:07/12/1941, 83 y.o., female Today's Date: 01/10/2024  END OF SESSION:  PT End of Session - 01/10/24 1406     Visit Number 1    Date for PT Re-Evaluation 03/06/24    Authorization Type BCBS    PT Start Time 1408    PT Stop Time 1455    PT Time Calculation (min) 47 min    Activity Tolerance Patient tolerated treatment well    Behavior During Therapy WFL for tasks assessed/performed             Past Medical History:  Diagnosis Date   GERD (gastroesophageal reflux disease)    Hypothyroidism    Thyroid  disease    Urticaria    Past Surgical History:  Procedure Laterality Date   APPENDECTOMY  1955   HAND SURGERY  12/14/2014   Hand palmar abscess deep irrigation and drainage   KNEE ARTHROSCOPY     Right Knee-1992 and 2007; Left Knee- 2001   KNEE SURGERY Right 11/16/2009   LAPAROSCOPIC CHOLECYSTECTOMY  02/2004   OTHER SURGICAL HISTORY  1988   Hysterectomy   PATELLECTOMY  11/16/2009   Partial Patellectomy   SURGERY OF LIP  2003   TUBAL LIGATION  1980   Patient Active Problem List   Diagnosis Date Noted   Bilateral impacted cerumen 07/07/2016   Chronic seasonal allergic rhinitis 07/07/2016   ETD (Eustachian tube dysfunction), bilateral 07/07/2016    PCP: Mordechai April, DO  REFERRING PROVIDER: Randal Bury, MD  REFERRING DIAG: M54.2 (ICD-10-CM) - Cervicalgia  THERAPY DIAG:  Cervicalgia  Cramp and spasm  Muscle weakness (generalized)  Abnormal posture  Rationale for Evaluation and Treatment: Rehabilitation  ONSET DATE: 09/11/2023  SUBJECTIVE:                                                                                                                                                                                                         SUBJECTIVE STATEMENT: Patient reports she has been having neck pain for   years.  She admits she is a "self  treater".  I don't like going to doctors or taking pills.  Past MRI confirms bulging disc.  She states she has been having symptom since 2016.  She exercises on a regular basis and describes several of her upper body exercises.  She worked in [employer] for her career.  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  She hopes to be able to  resolve her neck pain and be able to sleep through the night.    PERTINENT HISTORY:  na  PAIN:  Are you having pain? Yes: NPRS scale: didn't give number Pain location: neck and right upper trap area Pain description: aching /tight Aggravating factors: turning my head Relieving factors: rest, meds  PRECAUTIONS: None  RED FLAGS: None     WEIGHT BEARING RESTRICTIONS: No  FALLS:  Has patient fallen in last 6 months? No  LIVING ENVIRONMENT: Lives with: lives with their family Lives in: House/apartment   OCCUPATION: works Pharmacist, hospital, retired Therapist, music  PLOF: Independent, Independent with basic ADLs, Independent with household mobility without device, Independent with community mobility without device, Independent with homemaking with ambulation, Independent with gait, and Independent with transfers  PATIENT GOALS: She hopes to be able to resolve her neck pain and be able to sleep through the night.  NEXT MD VISIT: na  OBJECTIVE:  Note: Objective measures were completed at Evaluation unless otherwise noted.  DIAGNOSTIC FINDINGS:  None recent but patient states she had an MRI that shows disc bulge when she lived in [place] .   PATIENT SURVEYS:  NDI 3/50=6%  COGNITION: Overall cognitive status: Within functional limits for tasks assessed  SENSATION: Patient describes radicular symptoms as follows: intermittent down the arm but occasionally whole hand numbness and discomfort  POSTURE: rounded shoulders, forward head, and C spine with slight lateral shift to  right  PALPATION: Obvious trigger points and taut bands bilateral upper traps    CERVICAL ROM:   Active ROM A/PROM (deg) eval  Flexion 50  Extension 60  Right lateral flexion 40  Left lateral flexion 50  Right rotation 75  Left rotation 55   (Blank rows = not tested)  UPPER EXTREMITY ROM:  WFL  UPPER EXTREMITY MMT:   All generally 4+ to 5/5 with exception of right shoulder ER which was 4-/5 and scaption with IR which was 4-/5  CERVICAL SPECIAL TESTS:  Spurling's test: complete next visit  TREATMENT DATE: 01/10/24 Initial eval completed and initiated HEP Educated on anatomy of the spine and various conditions that she could be experiencing- could be some carpal tunnel along with the radicular symptoms coming from the c spine.                                                                                                                                 PATIENT EDUCATION:  Education details: See above Person educated: Patient Education method: Programmer, multimedia, Demonstration, Verbal cues, and Handouts Education comprehension: verbalized understanding, returned demonstration, and verbal cues required  HOME EXERCISE PROGRAM: Access Code: [email or web address] URL: [email or web address] Date: 01/10/2024 Prepared by: Aletha Anderson  Exercises - Seated Scapular Retraction  - 1 x daily - 7 x weekly - 3 sets - 10 reps - Shoulder Rolls in Sitting  - 1 x daily - 7 x weekly - 3 sets - 10 reps - Seated Cervical Retraction  - 1  x daily - 7 x weekly - 3 sets - 10 reps - Seated Cervical Rotation AROM  - 1 x daily - 7 x weekly - 3 sets - 10 reps - Seated Cervical Sidebending AROM  - 1 x daily - 7 x weekly - 3 sets - 10 reps - Shoulder extension with resistance - Neutral  - 1 x daily - 7 x weekly - 3 sets - 10 reps - Standing Shoulder Row with Anchored Resistance  - 1 x daily - 7 x weekly - 3 sets - 10 reps - Seated Shoulder External Rotation AAROM with Pulley  - 1 x daily - 7 x weekly -  3 sets - 10 reps - Seated Shoulder Horizontal Abduction with Resistance  - 1 x daily - 7 x weekly - 3 sets - 10 reps  ASSESSMENT:  CLINICAL IMPRESSION: Patient is an 83 y.o. female who was seen today for physical therapy evaluation and treatment for neck pain. She presents with decreased c spine rotation to left and right lateral flexion, observable right shoulder atrophy around the area of the supraspinatus, weakness of the right supraspinatus and infraspinatus, bilateral upper trap trigger points and taut bands.  She is admittedly a Barista" but did respond well to education and instruction.  She would benefit from skilled PT for postural strengthening, c spine ROM, dry needling if she agrees, and modalities for pain relief.  She has responded well to traction in the past, so this may also be an option.    OBJECTIVE IMPAIRMENTS: decreased ROM, decreased strength, hypomobility, increased fascial restrictions, increased muscle spasms, impaired flexibility, impaired sensation, impaired UE functional use, postural dysfunction, and pain.   ACTIVITY LIMITATIONS: carrying, transfers, bathing, dressing, reach over head, hygiene/grooming, and caring for others  PARTICIPATION LIMITATIONS: meal prep, cleaning, laundry, driving, shopping, community activity, occupation, and yard work  PERSONAL FACTORS: Behavior pattern, Education, Fitness, and Past/current experiences are also affecting patient's functional outcome.   REHAB POTENTIAL: Good  CLINICAL DECISION MAKING: Stable/uncomplicated  EVALUATION COMPLEXITY: Low   GOALS: Goals reviewed with patient? Yes  SHORT TERM GOALS: Target date: 02/07/2024  Pain report to be no greater than 4/10  Baseline:  Goal status: INITIAL  2.  Patient will be independent with initial HEP  Baseline:  Goal status: INITIAL   LONG TERM GOALS: Target date: 03/06/2024  Patient to report pain no greater than 2/10  Baseline:  Goal status: INITIAL  2.   Patient to be independent with advanced HEP  Baseline:  Goal status: INITIAL  3.  Patient to be able to sleep through the night  Baseline:  Goal status: INITIAL  4.  Patient will be able to reach overhead into cabinets and on top of shelves without pain  Baseline:  Goal status: INITIAL  5.  NDI to improve to 2% Baseline:  Goal status: INITIAL  6.  Patient to report 85% improvement in overall symptoms Baseline:  Goal status: INITIAL   PLAN:  PT FREQUENCY: 1-2x/week  PT DURATION: 8 weeks  PLANNED INTERVENTIONS: 97110-Therapeutic exercises, 97530- Therapeutic activity, [id number]- Neuromuscular re-education, 97535- Self Care, 40981- Manual therapy, [id number]- Canalith repositioning, [id number]- Aquatic Therapy, [id number]- Electrical stimulation (unattended), [id number]- Electrical stimulation (manual), [id number]- Vasopneumatic device, [id number]- Ultrasound, [id number]- Traction (mechanical), [id number]- Ionotophoresis 4mg /ml Dexamethasone , Patient/Family education, Taping, Dry Needling, Joint mobilization, Spinal mobilization, Vestibular training, Cryotherapy, and Moist heat  PLAN FOR NEXT SESSION: UBE, assess willingness to do DN, review HEP, progress postural strengthening  Lusine Corlett B. Amara Justen, PT 01/10/24 5:28 PM  [facility] [address] [place], [place] [address] Phone # [phone] Fax [phone]

## 2024-01-14 ENCOUNTER — Ambulatory Visit: Admitting: Physical Therapy

## 2024-01-14 ENCOUNTER — Encounter: Payer: Self-pay | Admitting: Physical Therapy

## 2024-01-14 DIAGNOSIS — R252 Cramp and spasm: Secondary | ICD-10-CM

## 2024-01-14 DIAGNOSIS — M542 Cervicalgia: Secondary | ICD-10-CM

## 2024-01-14 DIAGNOSIS — R293 Abnormal posture: Secondary | ICD-10-CM | POA: Diagnosis not present

## 2024-01-14 DIAGNOSIS — M6281 Muscle weakness (generalized): Secondary | ICD-10-CM

## 2024-01-14 NOTE — Therapy (Signed)
 OUTPATIENT PHYSICAL THERAPY CERVICAL TREATMENT   Patient Name: Meredith Escobar MRN: [id number] DOB:1940-12-21, 83 y.o., female Today's Date: 01/14/2024  END OF SESSION:  PT End of Session - 01/14/24 1408     Visit Number 2    Date for PT Re-Evaluation 03/06/24    Authorization Type BCBS    PT Start Time 1145    PT Stop Time 1231    PT Time Calculation (min) 46 min    Activity Tolerance Patient tolerated treatment well    Behavior During Therapy WFL for tasks assessed/performed              Past Medical History:  Diagnosis Date   GERD (gastroesophageal reflux disease)    Hypothyroidism    Thyroid  disease    Urticaria    Past Surgical History:  Procedure Laterality Date   APPENDECTOMY  1955   HAND SURGERY  12/14/2014   Hand palmar abscess deep irrigation and drainage   KNEE ARTHROSCOPY     Right Knee-1992 and 2007; Left Knee- 2001   KNEE SURGERY Right 11/16/2009   LAPAROSCOPIC CHOLECYSTECTOMY  02/2004   OTHER SURGICAL HISTORY  1988   Hysterectomy   PATELLECTOMY  11/16/2009   Partial Patellectomy   SURGERY OF LIP  2003   TUBAL LIGATION  1980   Patient Active Problem List   Diagnosis Date Noted   Bilateral impacted cerumen 07/07/2016   Chronic seasonal allergic rhinitis 07/07/2016   ETD (Eustachian tube dysfunction), bilateral 07/07/2016    PCP: Mordechai April, DO  REFERRING PROVIDER: Randal Bury, MD  REFERRING DIAG: M54.2 (ICD-10-CM) - Cervicalgia  THERAPY DIAG:  Cervicalgia  Cramp and spasm  Muscle weakness (generalized)  Abnormal posture  Rationale for Evaluation and Treatment: Rehabilitation  ONSET DATE: 09/11/2023  SUBJECTIVE:                                                                                                                                                                                                         SUBJECTIVE STATEMENT: Patient reports she is doing good today. She has been compliant with HEP. She is having some  discomfort but no pain. 1/10  From Eval: Patient reports she has been having neck pain for   years.  She admits she is a "self treater".  I don't like going to doctors or taking pills.  Past MRI confirms bulging disc.  She states she has been having symptom since 2016.  She exercises on a regular basis and describes several of her upper body exercises.  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  She hopes to be able to resolve her neck pain and be able to sleep through the night.    PERTINENT HISTORY:  na  PAIN:  Are you having pain? Yes: NPRS scale: didn't give number Pain location: neck and right upper trap area Pain description: aching /tight Aggravating factors: turning my head Relieving factors: rest, meds  PRECAUTIONS: None  RED FLAGS: None     WEIGHT BEARING RESTRICTIONS: No  FALLS:  Has patient fallen in last 6 months? No  LIVING ENVIRONMENT: Lives with: lives with their family Lives in: House/apartment   OCCUPATION: works Pharmacist, hospital, retired Therapist, music  PLOF: Independent, Independent with basic ADLs, Independent with household mobility without device, Independent with community mobility without device, Independent with homemaking with ambulation, Independent with gait, and Independent with transfers  PATIENT GOALS: She hopes to be able to resolve her neck pain and be able to sleep through the night.  NEXT MD VISIT: na  OBJECTIVE:  Note: Objective measures were completed at Evaluation unless otherwise noted.  DIAGNOSTIC FINDINGS:  None recent but patient states she had an MRI that shows disc bulge when she lived in [place] .   PATIENT SURVEYS:  NDI 3/50=6%  COGNITION: Overall cognitive status: Within functional limits for tasks assessed  SENSATION: Patient describes radicular symptoms as follows: intermittent down the arm but occasionally whole hand  numbness and discomfort  POSTURE: rounded shoulders, forward head, and C spine with slight lateral shift to right  PALPATION: Obvious trigger points and taut bands bilateral upper traps    CERVICAL ROM:   Active ROM A/PROM (deg) eval  Flexion 50  Extension 60  Right lateral flexion 40  Left lateral flexion 50  Right rotation 75  Left rotation 55   (Blank rows = not tested)  UPPER EXTREMITY ROM:  WFL  UPPER EXTREMITY MMT:   All generally 4+ to 5/5 with exception of right shoulder ER which was 4-/5 and scaption with IR which was 4-/5  CERVICAL SPECIAL TESTS:  Spurling's test: complete next visit  TREATMENT DATE:  01/14/2024 UBE 6 mins forwards only- PT present to discuss status Seated scapular retraction 3 sec holds x 12  Shoulder rolls x 10 each direction Seated cervical retraction x 10 Seated cervical rotation & side bending x 5 each direction Upper trap stretch 2 x 30 bilateral  Standing shoulder row with blue TB 3 x 10 Standing shoulder extension with blue TB 2 x 10 Seated shoulder ER with blue TB 2 x 10 Seated shoulder abduction with blue TB 2 x 10 Seated shoulder flexion & abduction 2# DB x 10 each    01/10/24 Initial eval completed and initiated HEP Educated on anatomy of the spine and various conditions that she could be experiencing- could be some carpal tunnel along with the radicular symptoms coming from the c spine.  PATIENT EDUCATION:  Education details: See above Person educated: Patient Education method: Programmer, multimedia, Demonstration, Verbal cues, and Handouts Education comprehension: verbalized understanding, returned demonstration, and verbal cues required  HOME EXERCISE PROGRAM: Access Code: [email or web address] URL: [email or web address] Date: 01/14/2024 Prepared by: Penelope Bowie  Exercises - Seated Scapular Retraction   - 1 x daily - 7 x weekly - 3 sets - 10 reps - Shoulder Rolls in Sitting  - 1 x daily - 7 x weekly - 3 sets - 10 reps - Seated Cervical Retraction  - 1 x daily - 7 x weekly - 3 sets - 10 reps - Seated Cervical Rotation AROM  - 1 x daily - 7 x weekly - 3 sets - 10 reps - Seated Cervical Sidebending AROM  - 1 x daily - 7 x weekly - 3 sets - 10 reps - Shoulder extension with resistance - Neutral  - 1 x daily - 7 x weekly - 3 sets - 10 reps - Standing Shoulder Row with Anchored Resistance  - 1 x daily - 7 x weekly - 3 sets - 10 reps - Seated Shoulder External Rotation AAROM with Pulley  - 1 x daily - 7 x weekly - 3 sets - 10 reps - Seated Shoulder Horizontal Abduction with Resistance  - 1 x daily - 7 x weekly - 3 sets - 10 reps - Seated Gentle Upper Trapezius Stretch  - 1 x daily - 7 x weekly - 2 sets - 20-30 hold - Scaption with Dumbbells  - 1 x daily - 7 x weekly - 1-2 sets - 10 reps - Standing Shoulder Flexion to 90 Degrees with Dumbbells  - 1 x daily - 7 x weekly - 1-2 sets - 10 reps  ASSESSMENT:  CLINICAL IMPRESSION: Meredith Escobar presents with first time follow up since evaluation. She verbalized compliance with HEP and she required minimal verbal cues for form correction. Updated HEP to include upper trap stretch. Overall, patient tolerated treatment session well. Patient verbalized that she has not had any hand numbness since starting the home exercises. Patient should progress well with physical therapy. Patient will benefit from skilled PT to address the below impairments and improve overall function.   OBJECTIVE IMPAIRMENTS: decreased ROM, decreased strength, hypomobility, increased fascial restrictions, increased muscle spasms, impaired flexibility, impaired sensation, impaired UE functional use, postural dysfunction, and pain.   ACTIVITY LIMITATIONS: carrying, transfers, bathing, dressing, reach over head, hygiene/grooming, and caring for others  PARTICIPATION LIMITATIONS: meal prep, cleaning,  laundry, driving, shopping, community activity, occupation, and yard work  PERSONAL FACTORS: Behavior pattern, Education, Fitness, and Past/current experiences are also affecting patient's functional outcome.   REHAB POTENTIAL: Good  CLINICAL DECISION MAKING: Stable/uncomplicated  EVALUATION COMPLEXITY: Low   GOALS: Goals reviewed with patient? Yes  SHORT TERM GOALS: Target date: 02/07/2024  Pain report to be no greater than 4/10  Baseline:  Goal status: INITIAL  2.  Patient will be independent with initial HEP  Baseline:  Goal status: INITIAL   LONG TERM GOALS: Target date: 03/06/2024  Patient to report pain no greater than 2/10  Baseline:  Goal status: INITIAL  2.  Patient to be independent with advanced HEP  Baseline:  Goal status: INITIAL  3.  Patient to be able to sleep through the night  Baseline:  Goal status: INITIAL  4.  Patient will be able to reach overhead into cabinets and on top of shelves without pain  Baseline:  Goal status: INITIAL  5.  NDI to improve to 2%  Baseline:  Goal status: INITIAL  6.  Patient to report 85% improvement in overall symptoms Baseline:  Goal status: INITIAL   PLAN:  PT FREQUENCY: 1-2x/week  PT DURATION: 8 weeks  PLANNED INTERVENTIONS: 97110-Therapeutic exercises, 97530- Therapeutic activity, [id number]- Neuromuscular re-education, 97535- Self Care, [id number]- Manual therapy, [id number]- Canalith repositioning, [id number]- Aquatic Therapy, [phone]- Electrical stimulation (unattended), [phone]- Electrical stimulation (manual), [id number]- Vasopneumatic device, [id number]- Ultrasound, [id number]- Traction (mechanical), [id number]- Ionotophoresis 4mg /ml Dexamethasone , Patient/Family education, Taping, Dry Needling, Joint mobilization, Spinal mobilization, Vestibular training, Cryotherapy, and Moist heat  PLAN FOR NEXT SESSION: assess tolerance to treatment session; assess willingness to do DN, review HEP, progress postural strengthening  Penelope Bowie, PT 01/14/24  2:09 PM [facility] [address], [address] [place], [place] [address] Phone # [phone] Fax [phone]

## 2024-01-17 DIAGNOSIS — H10411 Chronic giant papillary conjunctivitis, right eye: Secondary | ICD-10-CM | POA: Diagnosis not present

## 2024-01-17 DIAGNOSIS — T1511XA Foreign body in conjunctival sac, right eye, initial encounter: Secondary | ICD-10-CM | POA: Diagnosis not present

## 2024-01-29 ENCOUNTER — Encounter: Admitting: Physical Therapy

## 2024-01-30 ENCOUNTER — Ambulatory Visit: Admitting: Physical Therapy

## 2024-01-30 ENCOUNTER — Encounter: Payer: Self-pay | Admitting: Physical Therapy

## 2024-01-30 DIAGNOSIS — M542 Cervicalgia: Secondary | ICD-10-CM

## 2024-01-30 DIAGNOSIS — M6281 Muscle weakness (generalized): Secondary | ICD-10-CM

## 2024-01-30 DIAGNOSIS — R293 Abnormal posture: Secondary | ICD-10-CM

## 2024-01-30 DIAGNOSIS — R252 Cramp and spasm: Secondary | ICD-10-CM | POA: Diagnosis not present

## 2024-01-30 NOTE — Therapy (Signed)
 OUTPATIENT PHYSICAL THERAPY CERVICAL TREATMENT/ DISCHARGE NOTE   Patient Name: Meredith Escobar MRN: [id number] DOB:10-Aug-1941, 83 y.o., female Today's Date: 01/30/2024  END OF SESSION:  PT End of Session - 01/30/24 1228     Visit Number 3    Date for PT Re-Evaluation 03/06/24    Authorization Type BCBS    PT Start Time 1146    PT Stop Time 1226    PT Time Calculation (min) 40 min    Activity Tolerance Patient tolerated treatment well    Behavior During Therapy WFL for tasks assessed/performed               Past Medical History:  Diagnosis Date   GERD (gastroesophageal reflux disease)    Hypothyroidism    Thyroid  disease    Urticaria    Past Surgical History:  Procedure Laterality Date   APPENDECTOMY  1955   HAND SURGERY  12/14/2014   Hand palmar abscess deep irrigation and drainage   KNEE ARTHROSCOPY     Right Knee-1992 and 2007; Left Knee- 2001   KNEE SURGERY Right 11/16/2009   LAPAROSCOPIC CHOLECYSTECTOMY  02/2004   OTHER SURGICAL HISTORY  1988   Hysterectomy   PATELLECTOMY  11/16/2009   Partial Patellectomy   SURGERY OF LIP  2003   TUBAL LIGATION  1980   Patient Active Problem List   Diagnosis Date Noted   Bilateral impacted cerumen 07/07/2016   Chronic seasonal allergic rhinitis 07/07/2016   ETD (Eustachian tube dysfunction), bilateral 07/07/2016    PCP: Mordechai April, DO  REFERRING PROVIDER: Randal Bury, MD  REFERRING DIAG: M54.2 (ICD-10-CM) - Cervicalgia  THERAPY DIAG:  Cervicalgia  Cramp and spasm  Muscle weakness (generalized)  Abnormal posture  Rationale for Evaluation and Treatment: Rehabilitation  ONSET DATE: 09/11/2023  SUBJECTIVE:                                                                                                                                                                                                         SUBJECTIVE STATEMENT: Patient reports she has not had any pain since last session. She has been  doing her HEP and going to the gym.  From Eval: Patient reports she has been having neck pain for   years.  She admits she is a "self treater".  I don't like going to doctors or taking pills.  Past MRI confirms bulging disc.  She states she has been having symptom since 2016.  She exercises on a regular basis and describes several of her upper body exercises.  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  She hopes to be able to resolve her neck pain and be able to sleep through the night.    PERTINENT HISTORY:  na  PAIN:  Are you having pain? Yes: NPRS scale: didn't give number Pain location: neck and right upper trap area Pain description: aching /tight Aggravating factors: turning my head Relieving factors: rest, meds  PRECAUTIONS: None  RED FLAGS: None     WEIGHT BEARING RESTRICTIONS: No  FALLS:  Has patient fallen in last 6 months? No  LIVING ENVIRONMENT: Lives with: lives with their family Lives in: House/apartment   OCCUPATION: works Pharmacist, hospital, retired Therapist, music  PLOF: Independent, Independent with basic ADLs, Independent with household mobility without device, Independent with community mobility without device, Independent with homemaking with ambulation, Independent with gait, and Independent with transfers  PATIENT GOALS: She hopes to be able to resolve her neck pain and be able to sleep through the night.  NEXT MD VISIT: na  OBJECTIVE:  Note: Objective measures were completed at Evaluation unless otherwise noted.  DIAGNOSTIC FINDINGS:  None recent but patient states she had an MRI that shows disc bulge when she lived in [place] .   PATIENT SURVEYS:  NDI 3/50=6%  COGNITION: Overall cognitive status: Within functional limits for tasks assessed  SENSATION: Patient describes radicular symptoms as follows: intermittent down the arm but occasionally whole  hand numbness and discomfort  POSTURE: rounded shoulders, forward head, and C spine with slight lateral shift to right  PALPATION: Obvious trigger points and taut bands bilateral upper traps    CERVICAL ROM:   Active ROM A/PROM (deg) eval  Flexion 50  Extension 60  Right lateral flexion 40  Left lateral flexion 50  Right rotation 75  Left rotation 55   (Blank rows = not tested)  UPPER EXTREMITY ROM:  WFL  UPPER EXTREMITY MMT:   All generally 4+ to 5/5 with exception of right shoulder ER which was 4-/5 and scaption with IR which was 4-/5  CERVICAL SPECIAL TESTS:  Spurling's test: complete next visit  TREATMENT DATE:  01/30/2024 UBE 4/2 6 mins total- PT present to discuss status NDI 0/50 0% 3 way scap stabilization with blue loop x 8 each direction Lat pull down 45# 2 x 10 Y's at wall with blue loop 2 x 10 Plank taps at wall 2 x 10 Review of HEP     01/14/2024 UBE 6 mins forwards only- PT present to discuss status Seated scapular retraction 3 sec holds x 12  Shoulder rolls x 10 each direction Seated cervical retraction x 10 Seated cervical rotation & side bending x 5 each direction Upper trap stretch 2 x 30 bilateral  Standing shoulder row with blue TB 3 x 10 Standing shoulder extension with blue TB 2 x 10 Seated shoulder ER with blue TB 2 x 10 Seated shoulder abduction with blue TB 2 x 10 Seated shoulder flexion & abduction 2# DB x 10 each    01/10/24 Initial eval completed and initiated HEP Educated on anatomy of the spine and various conditions that she could be experiencing- could be some carpal tunnel along with the radicular symptoms coming from the c spine.  PATIENT EDUCATION:  Education details: See above Person educated: Patient Education method: Programmer, multimedia, Demonstration, Verbal cues, and Handouts Education comprehension:  verbalized understanding, returned demonstration, and verbal cues required  HOME EXERCISE PROGRAM: Access Code: [email or web address] URL: [email or web address] Date: 01/30/2024 Prepared by: Penelope Bowie  Exercises - Seated Scapular Retraction  - 1 x daily - 7 x weekly - 3 sets - 10 reps - Shoulder Rolls in Sitting  - 1 x daily - 7 x weekly - 3 sets - 10 reps - Seated Cervical Retraction  - 1 x daily - 7 x weekly - 3 sets - 10 reps - Seated Cervical Rotation AROM  - 1 x daily - 7 x weekly - 3 sets - 10 reps - Seated Cervical Sidebending AROM  - 1 x daily - 7 x weekly - 3 sets - 10 reps - Shoulder extension with resistance - Neutral  - 1 x daily - 7 x weekly - 3 sets - 10 reps - Standing Shoulder Row with Anchored Resistance  - 1 x daily - 7 x weekly - 3 sets - 10 reps - Seated Shoulder External Rotation AAROM with Pulley  - 1 x daily - 7 x weekly - 3 sets - 10 reps - Seated Shoulder Horizontal Abduction with Resistance  - 1 x daily - 7 x weekly - 3 sets - 10 reps - Seated Gentle Upper Trapezius Stretch  - 1 x daily - 7 x weekly - 2 sets - 20-30 hold - Scaption with Dumbbells  - 1 x daily - 7 x weekly - 1-2 sets - 10 reps - Standing Shoulder Flexion to 90 Degrees with Dumbbells  - 1 x daily - 7 x weekly - 1-2 sets - 10 reps - Scapular Stability on Wall With Resistance Band (Hemiplegia)   - 1 x daily - 7 x weekly - 1 sets - 8 reps - Low Trap Setting at Wall  - 1 x daily - 7 x weekly - 1-2 sets - 10 reps - Full Plank on Counter, Opposite Shoulder Taps  - 1 x daily - 7 x weekly - 2 sets - 10 reps  ASSESSMENT:  CLINICAL IMPRESSION: Ryenne is pleased with her functional status. She is not having any more neck pain or numbness. All goals are met at this time. She is independent with HEP and goes to the gym regularly. Updated HEP to include exercise progressions and notes for reminders for patient. Patient to discharge home with HEP.     OBJECTIVE IMPAIRMENTS: decreased ROM, decreased strength,  hypomobility, increased fascial restrictions, increased muscle spasms, impaired flexibility, impaired sensation, impaired UE functional use, postural dysfunction, and pain.   ACTIVITY LIMITATIONS: carrying, transfers, bathing, dressing, reach over head, hygiene/grooming, and caring for others  PARTICIPATION LIMITATIONS: meal prep, cleaning, laundry, driving, shopping, community activity, occupation, and yard work  PERSONAL FACTORS: Behavior pattern, Education, Fitness, and Past/current experiences are also affecting patient's functional outcome.   REHAB POTENTIAL: Good  CLINICAL DECISION MAKING: Stable/uncomplicated  EVALUATION COMPLEXITY: Low   GOALS: Goals reviewed with patient? Yes  SHORT TERM GOALS: Target date: 02/07/2024  Pain report to be no greater than 4/10  Baseline:  Goal status: MET 01/30/2024  2.  Patient will be independent with initial HEP  Baseline:  Goal status: MET 01/30/2024   LONG TERM GOALS: Target date: 03/06/2024  Patient to report pain no greater than 2/10  Baseline:  Goal status: MET 01/30/2024  2.  Patient to be independent with advanced HEP  Baseline:  Goal status: MET 01/30/2024  3.  Patient to be able to sleep through the night  Baseline:  Goal status: MET 01/30/2024  4.  Patient will be able to reach overhead into cabinets and on top of shelves without pain  Baseline:  Goal status: MET 01/30/2024  5.  NDI to improve to 2% Baseline:  Goal status: MET 01/30/2024  6.  Patient to report 85% improvement in overall symptoms Baseline:  Goal status: MET 01/30/2024   PLAN:  PT FREQUENCY: 1-2x/week  PT DURATION: 8 weeks  PLANNED INTERVENTIONS: 97110-Therapeutic exercises, 97530- Therapeutic activity, 97112- Neuromuscular re-education, 97535- Self Care, 91478- Manual therapy, [phone]- Canalith repositioning, [id number]- Aquatic Therapy, [phone]- Electrical stimulation (unattended), [phone]- Electrical stimulation (manual), [id number]- Vasopneumatic device,  [id number]- Ultrasound, [id number]- Traction (mechanical), [id number]- Ionotophoresis 4mg /ml Dexamethasone , Patient/Family education, Taping, Dry Needling, Joint mobilization, Spinal mobilization, Vestibular training, Cryotherapy, and Moist heat  PLAN FOR NEXT SESSION: patient to discharge home with HEP  Penelope Bowie, PT 01/30/24 12:28 PM [facility] [address], [address] [place], [place] [address] Phone # [phone] Fax [phone]   PHYSICAL THERAPY DISCHARGE SUMMARY  Visits from Start of Care: 3  Current functional level related to goals / functional outcomes: Nakari is pleased with her functional status. She is not having any more neck pain or numbness. She is independent with HEP and goes to the gym regularly.   Remaining deficits: None   Education / Equipment: See above   Patient agrees to discharge. Patient goals were met. Patient is being discharged due to being pleased with the current functional level.

## 2024-01-31 ENCOUNTER — Encounter

## 2024-03-18 DIAGNOSIS — Z860100 Personal history of colon polyps, unspecified: Secondary | ICD-10-CM | POA: Diagnosis not present

## 2024-03-18 DIAGNOSIS — K635 Polyp of colon: Secondary | ICD-10-CM | POA: Diagnosis not present

## 2024-03-18 DIAGNOSIS — K6289 Other specified diseases of anus and rectum: Secondary | ICD-10-CM | POA: Diagnosis not present

## 2024-03-18 DIAGNOSIS — Z09 Encounter for follow-up examination after completed treatment for conditions other than malignant neoplasm: Secondary | ICD-10-CM | POA: Diagnosis not present

## 2024-03-18 DIAGNOSIS — K5730 Diverticulosis of large intestine without perforation or abscess without bleeding: Secondary | ICD-10-CM | POA: Diagnosis not present

## 2024-03-18 DIAGNOSIS — K626 Ulcer of anus and rectum: Secondary | ICD-10-CM | POA: Diagnosis not present

## 2024-03-20 DIAGNOSIS — K635 Polyp of colon: Secondary | ICD-10-CM | POA: Diagnosis not present

## 2024-03-25 DIAGNOSIS — R7303 Prediabetes: Secondary | ICD-10-CM | POA: Diagnosis not present

## 2024-03-25 DIAGNOSIS — E039 Hypothyroidism, unspecified: Secondary | ICD-10-CM | POA: Diagnosis not present

## 2024-03-25 DIAGNOSIS — E559 Vitamin D deficiency, unspecified: Secondary | ICD-10-CM | POA: Diagnosis not present

## 2024-03-25 DIAGNOSIS — Z0000 Encounter for general adult medical examination without abnormal findings: Secondary | ICD-10-CM | POA: Diagnosis not present

## 2024-03-25 DIAGNOSIS — N1831 Chronic kidney disease, stage 3a: Secondary | ICD-10-CM | POA: Diagnosis not present

## 2024-04-07 DIAGNOSIS — N1831 Chronic kidney disease, stage 3a: Secondary | ICD-10-CM | POA: Diagnosis not present

## 2024-04-14 DIAGNOSIS — Z8619 Personal history of other infectious and parasitic diseases: Secondary | ICD-10-CM | POA: Diagnosis not present

## 2024-04-14 DIAGNOSIS — K641 Second degree hemorrhoids: Secondary | ICD-10-CM | POA: Diagnosis not present

## 2024-04-14 DIAGNOSIS — K449 Diaphragmatic hernia without obstruction or gangrene: Secondary | ICD-10-CM | POA: Diagnosis not present

## 2024-04-24 DIAGNOSIS — H11441 Conjunctival cysts, right eye: Secondary | ICD-10-CM | POA: Diagnosis not present

## 2024-04-24 DIAGNOSIS — H43813 Vitreous degeneration, bilateral: Secondary | ICD-10-CM | POA: Diagnosis not present

## 2024-06-27 DIAGNOSIS — H11441 Conjunctival cysts, right eye: Secondary | ICD-10-CM | POA: Diagnosis not present

## 2024-07-22 DIAGNOSIS — M65342 Trigger finger, left ring finger: Secondary | ICD-10-CM | POA: Diagnosis not present

## 2024-07-28 DIAGNOSIS — H11441 Conjunctival cysts, right eye: Secondary | ICD-10-CM | POA: Diagnosis not present

## 2024-08-01 DIAGNOSIS — G5601 Carpal tunnel syndrome, right upper limb: Secondary | ICD-10-CM | POA: Diagnosis not present

## 2024-08-04 DIAGNOSIS — G5601 Carpal tunnel syndrome, right upper limb: Secondary | ICD-10-CM | POA: Diagnosis not present

## 2024-08-04 DIAGNOSIS — M25531 Pain in right wrist: Secondary | ICD-10-CM | POA: Diagnosis not present

## 2024-08-25 ENCOUNTER — Other Ambulatory Visit

## 2024-08-28 DIAGNOSIS — Z8619 Personal history of other infectious and parasitic diseases: Secondary | ICD-10-CM | POA: Diagnosis not present

## 2024-08-28 DIAGNOSIS — R109 Unspecified abdominal pain: Secondary | ICD-10-CM | POA: Diagnosis not present

## 2024-08-28 DIAGNOSIS — K30 Functional dyspepsia: Secondary | ICD-10-CM | POA: Diagnosis not present

## 2024-08-28 DIAGNOSIS — K219 Gastro-esophageal reflux disease without esophagitis: Secondary | ICD-10-CM | POA: Diagnosis not present

## 2024-09-01 ENCOUNTER — Other Ambulatory Visit (HOSPITAL_BASED_OUTPATIENT_CLINIC_OR_DEPARTMENT_OTHER)

## 2024-11-17 ENCOUNTER — Other Ambulatory Visit (HOSPITAL_BASED_OUTPATIENT_CLINIC_OR_DEPARTMENT_OTHER)
